# Patient Record
Sex: Male | Born: 1935 | Race: Black or African American | Hispanic: No | Marital: Married | Smoking: Never smoker
Health system: Southern US, Community
[De-identification: ages and names within clinical notes are randomized; demographics above are authoritative.]

## PROBLEM LIST (undated history)

## (undated) DIAGNOSIS — C4022 Malignant neoplasm of long bones of left lower limb: Secondary | ICD-10-CM

## (undated) DIAGNOSIS — M1990 Unspecified osteoarthritis, unspecified site: Secondary | ICD-10-CM

## (undated) DIAGNOSIS — Z9289 Personal history of other medical treatment: Secondary | ICD-10-CM

## (undated) DIAGNOSIS — K219 Gastro-esophageal reflux disease without esophagitis: Secondary | ICD-10-CM

## (undated) DIAGNOSIS — I10 Essential (primary) hypertension: Secondary | ICD-10-CM

## (undated) DIAGNOSIS — R7303 Prediabetes: Secondary | ICD-10-CM

## (undated) DIAGNOSIS — M539 Dorsopathy, unspecified: Secondary | ICD-10-CM

## (undated) HISTORY — PX: BACK SURGERY: SHX140

## (undated) HISTORY — DX: Essential (primary) hypertension: I10

## (undated) HISTORY — PX: SPINE SURGERY: SHX786

## (undated) HISTORY — PX: CERVICAL FUSION: SHX112

## (undated) HISTORY — DX: Gastro-esophageal reflux disease without esophagitis: K21.9

## (undated) HISTORY — PX: CHOLECYSTECTOMY: SHX55

## (undated) HISTORY — DX: Dorsopathy, unspecified: M53.9

---

## 1989-06-23 HISTORY — PX: HERNIA REPAIR: SHX51

## 1999-10-14 ENCOUNTER — Ambulatory Visit (HOSPITAL_COMMUNITY): Admission: RE | Admit: 1999-10-14 | Discharge: 1999-10-14 | Payer: Self-pay | Admitting: Internal Medicine

## 1999-10-14 ENCOUNTER — Encounter: Payer: Self-pay | Admitting: Internal Medicine

## 2001-01-14 ENCOUNTER — Encounter: Payer: Self-pay | Admitting: Internal Medicine

## 2001-01-14 ENCOUNTER — Encounter: Admission: RE | Admit: 2001-01-14 | Discharge: 2001-01-14 | Payer: Self-pay | Admitting: Internal Medicine

## 2001-12-03 ENCOUNTER — Encounter: Admission: RE | Admit: 2001-12-03 | Discharge: 2001-12-03 | Payer: Self-pay | Admitting: *Deleted

## 2001-12-03 ENCOUNTER — Encounter: Payer: Self-pay | Admitting: Orthopedic Surgery

## 2001-12-17 ENCOUNTER — Encounter: Admission: RE | Admit: 2001-12-17 | Discharge: 2001-12-17 | Payer: Self-pay | Admitting: Orthopedic Surgery

## 2001-12-17 ENCOUNTER — Encounter: Payer: Self-pay | Admitting: Orthopedic Surgery

## 2002-01-13 ENCOUNTER — Encounter: Admission: RE | Admit: 2002-01-13 | Discharge: 2002-01-13 | Payer: Self-pay | Admitting: Orthopedic Surgery

## 2002-01-13 ENCOUNTER — Encounter: Payer: Self-pay | Admitting: Orthopedic Surgery

## 2002-05-09 ENCOUNTER — Encounter: Payer: Self-pay | Admitting: Neurosurgery

## 2002-05-12 ENCOUNTER — Ambulatory Visit (HOSPITAL_COMMUNITY): Admission: RE | Admit: 2002-05-12 | Discharge: 2002-05-13 | Payer: Self-pay | Admitting: Neurosurgery

## 2002-05-12 ENCOUNTER — Encounter: Payer: Self-pay | Admitting: Neurosurgery

## 2002-09-02 ENCOUNTER — Ambulatory Visit (HOSPITAL_COMMUNITY): Admission: RE | Admit: 2002-09-02 | Discharge: 2002-09-02 | Payer: Self-pay | Admitting: Gastroenterology

## 2002-11-25 ENCOUNTER — Encounter: Payer: Self-pay | Admitting: Neurosurgery

## 2002-11-26 ENCOUNTER — Inpatient Hospital Stay (HOSPITAL_COMMUNITY): Admission: RE | Admit: 2002-11-26 | Discharge: 2002-11-27 | Payer: Self-pay | Admitting: Neurosurgery

## 2002-11-26 ENCOUNTER — Encounter: Payer: Self-pay | Admitting: Neurosurgery

## 2002-12-24 ENCOUNTER — Ambulatory Visit (HOSPITAL_COMMUNITY): Admission: RE | Admit: 2002-12-24 | Discharge: 2002-12-24 | Payer: Self-pay | Admitting: Neurosurgery

## 2002-12-24 ENCOUNTER — Encounter: Payer: Self-pay | Admitting: Neurosurgery

## 2002-12-24 ENCOUNTER — Inpatient Hospital Stay (HOSPITAL_COMMUNITY): Admission: AD | Admit: 2002-12-24 | Discharge: 2002-12-28 | Payer: Self-pay | Admitting: Neurosurgery

## 2002-12-25 ENCOUNTER — Encounter: Payer: Self-pay | Admitting: Neurosurgery

## 2004-11-29 ENCOUNTER — Encounter: Admission: RE | Admit: 2004-11-29 | Discharge: 2004-11-29 | Payer: Self-pay | Admitting: Internal Medicine

## 2006-12-18 ENCOUNTER — Encounter: Admission: RE | Admit: 2006-12-18 | Discharge: 2006-12-18 | Payer: Self-pay | Admitting: Internal Medicine

## 2010-01-06 ENCOUNTER — Encounter: Admission: RE | Admit: 2010-01-06 | Discharge: 2010-01-06 | Payer: Self-pay | Admitting: Internal Medicine

## 2010-10-23 DIAGNOSIS — Z9289 Personal history of other medical treatment: Secondary | ICD-10-CM

## 2010-10-23 HISTORY — DX: Personal history of other medical treatment: Z92.89

## 2011-03-10 NOTE — Op Note (Signed)
   NAME:  Kevin Mcintyre, Kevin Mcintyre                       ACCOUNT NO.:  [id number]   MEDICAL RECORD NO.:  [id number]                   PATIENT TYPE:  AMB   LOCATION:  ENDO                                 FACILITY:  [facility]   PHYSICIAN:  Anselmo Rod, M.D.               DATE OF BIRTH:  [birth year]   DATE OF PROCEDURE:  09/02/2002  DATE OF DISCHARGE:                                 OPERATIVE REPORT   PROCEDURE PERFORMED:  Colonoscopy.   ENDOSCOPIST:  Charna Elizabeth, M.D.   INSTRUMENT USED:  Olympus video colonoscope.   INDICATIONS FOR PROCEDURE:  The patient is a 77 year old African-American  male undergoing screening colonoscopy.  The patient has a history of rectal  bleeding and a known history of internal hemorrhoids.  Rule out colonic  masses, etc.   PREPROCEDURE PREPARATION:  Informed consent was procured from the patient.  The patient was fasted for eight hours prior to the procedure and prepped  with a bottle of magnesium citrate and a gallon of NuLytely the night prior  to the procedure.   PREPROCEDURE PHYSICAL:  The patient had stable vital signs. Neck supple.  Chest clear to auscultation.  S1 and S2 regular.  Abdomen soft with normal  bowel sounds.   DESCRIPTION OF PROCEDURE:  The patient was placed in left lateral decubitus  position and sedated with 60 mg of Demerol and 6 mg of Versed intravenously.  Once the patient was adequately sedated and maintained on low flow oxygen  and continuous cardiac monitoring, the Olympus video colonoscope was  advanced from the rectum and terminal ileum without difficulty.  Except for  prominent internal hemorrhoids  that seemed inflamed, no other abnormalities  were noted.  No masses, polyps, polyps, erosions, ulcerations or diverticula  were seen.   IMPRESSION:  Normal colonoscopy up to the terminal ileum except for  prominent bleeding internal hemorrhoids.   RECOMMENDATIONS:  1. Continue local steroid therapy for the hemorrhoids.  2.  Repeat colorectal cancer screening in the next 10 years unless the     patient develops any abnormal symptoms in     the interim.  3. A high fiber diet with 15 to 20 gm of fiber in the diet along with     liberal fluid intake.  4. Outpatient follow-up on a p.r.n. basis.                                                   Anselmo Rod, M.D.    JNM/MEDQ  D:  09/02/2002  T:  09/02/2002  Job:  [id number]   cc:   Olene Craven, M.D.  [phone] [address], [address]  [place]  [place] [address]  Fax: [phone]

## 2011-03-10 NOTE — Op Note (Signed)
NAME:  MANSA, WILLERS NO.:  [id number]   MEDICAL RECORD NO.:  [id number]                   PATIENT TYPE:  INP   LOCATION:  3172                                 FACILITY:  [facility]   PHYSICIAN:  Hewitt Shorts, M.D.            DATE OF BIRTH:  1934/02/24   DATE OF PROCEDURE:  11/26/2002  DATE OF DISCHARGE:                                 OPERATIVE REPORT   PREOPERATIVE DIAGNOSIS:  Recurrent right L4-5 lumbar disk herniation.   POSTOPERATIVE DIAGNOSIS:  Recurrent right L4-5 lumbar disk herniation.   PROCEDURE:  Right L4-5 lumbar laminotomy and microdiskectomy.   SURGEON:  Hewitt Shorts, M.D.   ASSISTANT:  Coletta Memos, M.D.   ANESTHESIA:  General endotracheal.   INDICATIONS FOR PROCEDURE:  The patient is a 77 year old man who is about 6-  1/2 months status post a right L4-5 lumbar laminotomy and microdiskectomy  developed recurrent radicular pain and was found by MRI scan to have  enlarged recurrent right L4-5 lumbar disk herniation.  The decision was made  to proceed with elective laminotomy and elective diskectomy.   PROCEDURE IN DETAIL:  The patient was brought to the operating room and  placed on the table in the supine position.  The patient was turned to a  prone position.  The lumbar region was prepped with Betadine soap solution  and draped in a sterile fashion.  The previous incision over the L4-5 lamina  was reopened.  The line of the incision was infiltrated with 1% __________  with epinephrine.  Dissection was carried down to the subcutaneous tissue  down to the lumbar fascia which was incised from the right side of the  midline and the paraspinal muscle was dissected from the spinous process and  lamina in a subperiosteal fashion.  The L4-5 interlaminar space was  identified and x-rays taken to confirm the localization.  The microscope was  then draped and brought into the field to provide additional magnification,  illumination, and visualization, and the remainder of the procedure was  performed using microdissection and microsurgical technique.  The scar  tissue within the laminotomy defect was carefully dissected from the edges  of the laminotomy.  The laminotomy was extended slightly rostrally and the  thecal sac and right L5 nerve root were identified.  These were gently  retracted medially and disk herniation identified.  We entered into the disk  space and removed extensive amounts of degenerated disk material.  The disk  herniation was mobilized from the ventral epidural space and we were able to  thereby decompress the thecal sac and nerve root.  __________ diskectomy was  performed with removal of all loose fragments of disk material from disk  space and the epidural space and decompression of the thecal sac and nerve  root was achieved.  Hemostasis was established with the use of bipolar  cautery as well as Gelfoam soaked in thrombin.  All  Gelfoam was removed  though prior to closure.  Once hemostasis was established we instilled 2 mL  of fentanyl and 80 mg of Depo-Medrol into the epidural space and then  proceeded with closure.  The deep fascia layer was closed with undyed 1  Vicryl sutures, the subcutaneous layer was closed with interrupted inverted  undyed 1 Vicryl sutures, the subcutaneous subcuticular were closed with  interrupted inverted 2-0 and 3-0 undyed Vicryl sutures and skin was  approximated  with Dermabond.  The patient tolerated the procedure well.  The estimated  blood loss was 25 mL.  Sponge and needle count were correct.  Following  surgery the patient was turned back to supine position, reversed from  anesthesia and extubated and transferred to the recovery room for further  care.                                               Hewitt Shorts, M.D.    RWN/MEDQ  D:  11/26/2002  T:  11/26/2002  Job:  [id number]

## 2011-03-10 NOTE — Op Note (Signed)
NAME:  Kevin Mcintyre, BATY NO.:  [id number]   MEDICAL RECORD NO.:  [id number]                   PATIENT TYPE:  INP   LOCATION:  3024                                 FACILITY:  [facility]   PHYSICIAN:  Hewitt Shorts, M.D.            DATE OF BIRTH:  [birth year]   DATE OF PROCEDURE:  12/25/2002  DATE OF DISCHARGE:                                 OPERATIVE REPORT   PREOPERATIVE DIAGNOSIS:  Recurrent right L4-5 lumbar disk herniation.   POSTOPERATIVE DIAGNOSIS:  Recurrent L4-5 lumbar disk herniation.   PROCEDURES:  1. Right L4-5 lumbar laminotomy and microdiskectomy with microdissection.  2. A right L4-5 transverse posterior lumbar interbody fusion with 13 mm     Synthes T-PLIF bone and Vitoss with bone marrow aspirate.  3. Bilateral L4-5 posterolateral arthrodesis with 90D posterior     instrumentation and Vitoss with bone marrow aspirate, with intraoperative     C-arm fluoroscopy.   SURGEON:  Hewitt Shorts, M.D.   ASSISTANT:  Clydene Fake, M.D.   ANESTHESIA:  General endotracheal.   INDICATIONS:  The patient is a 77 year old man who presented with a  recurrent lumbar radiculopathy and was found by MRI scan to have a large  recurrent L4-5 disk herniation.  A decision was made to proceed with  diskectomy and arthrodesis.   DESCRIPTION OF PROCEDURE:  The patient was brought to the operating room and  placed under general endotracheal anesthesia. The patient was turned to a  prone position.  The lumbar region was prepped with Betadine soap and  solution and draped in a sterile fashion.  The previous midline incision was  reopened and the incision was extended rostrally and caudally.  Dissection  was carried down to the lumbar fascia, which was incised bilaterally, and  the paraspinal muscles were dissected from the spinous processes and laminae  in a subperiosteal fashion.  Particular care, though, was taken at the L4-5  level, where there was  laminotomy.  We were able to expose the laminotomy  and dissect around it.  We exposed the transverse process of L4 and L5  bilaterally and decorticated the surfaces of the bone.  The microscope was  draped and brought into the field to provide additional magnification,  illumination, and visualization, and then the L4-5 laminotomy was extended  using the [facility] Max drill and Kerrison punches.  The thecal sac and right L4  and L5 nerve roots were identified.  The disk space was identified, and we  were able to enter into the disk space and initiate the diskectomy using a  variety of pituitary rongeurs.  We were able to remove fragments of disk  from both the disk space and the epidural space.  We were able to decompress  the thecal sac and nerve roots.  We identified the pedicles of L4 and L5 on  the right side.  The end plates of the L4 and  L5 vertebrae were cleaned of  the cartilaginous end plate and then using a variety of trial spacers, we  selected a 13 mm T-PLIF bone.  We used a laminar spreader on the facet  remnants of L4 and L5 on the right side to create a bit of gentle  distraction and then a 32 mm graft was positioned and tamped into position  and placed.  The distraction was then relaxed and the graft was secured in  place.  We then brought into the field the fluoroscopy unit and identified  pedicle entry sites for L4 and L5 bilaterally.  The cortex overlying the  pedicle entry site was opened using the [facility] drill and then using  pedicle probes, we probed each of the four pedicles.  This was examined with  a blunt probe, then.  No cut-outs were found.  We then used a 5.25 mm tap.  Each pedicle was tapped, and then we placed a 6.75 x 40 mm screw at each  site.  After each pedicle was tapped, we did use a ball probe to examine the  threading and to again check for any cut-outs, and none were found.  Once  all four screws were in place we took an AP image, which showed good   trapezoidal trajectory.  We then placed the 40 mm rod on the left, and we  cut a rod 30 mm in length to place on the right.  Locking caps were placed,  and final tightening was done of the locking caps.  We then packed the  intervertebral disk space with Vitoss which had been soaked in bone marrow  aspirate that had been obtained when we probed the pedicles and aspirated  bone marrow aspirate from the vertebral bodies.  The bone graft was packed  into the intervertebral disk space and then was also packed over the  transverse processes at L4 and L5 bilaterally as well as across the  intertransverse space at that level.  The thecal sac and nerve roots were  then checked to ensure that they were well-decompressed and that none of the  bone graft was pressing up against them.  This was confirmed and then we  proceeded with closure.  The deep fascia was closed with interrupted, undyed  1 Vicryl suture, the subcutaneous and subcuticular layer were closed with  interrupted, inverted 2-0 undyed Vicryl sutures, and the skin was  reapproximated with Dermabond.  The patient tolerated the procedure well.  The estimated blood loss was 600 mL.  We were able to give the patient back  200 mL of Cell Saver blood.  Sponge and needle count were correct.  Following surgery the patient was turned back to the supine position,  reversed from the anesthetic, to be extubated and transferred to the  recovery room for further care and was noted to be moving all four  extremities to command.                                                Hewitt Shorts, M.D.    RWN/MEDQ  D:  12/25/2002  T:  12/26/2002  Job:  [id number]

## 2011-03-10 NOTE — H&P (Signed)
NAME:  Kevin Mcintyre, LAMP NO.:  [id number]   MEDICAL RECORD NO.:  [id number]                   PATIENT TYPE:  OUT   LOCATION:  MRI                                  FACILITY:  [facility]   PHYSICIAN:  Hewitt Shorts, M.D.            DATE OF BIRTH:  08-Jan-1934   DATE OF ADMISSION:  12/24/2002  DATE OF DISCHARGE:                                HISTORY & PHYSICAL   HISTORY OF PRESENT ILLNESS:  The patient is a 77 year old right handed black  male who has been a patient of mine since August, 1997.  He is most recently  one month status post a right L4-5 lumbar laminotomy and micro diskectomy  for disk herniation, having undergone a right L4-5 lumbar laminotomy and  micro diskectomy for his initial disk herniation in July, 2003.   The patient explains that he did well for the first two weeks following his  most recent surgery.  He was immobilized postoperatively in a lumbar corset  however, approximately two weeks following that surgery he developed  recurrent right lumbar radicular pain.  He was seen in the office last week  and we scheduled an MRI scan for evaluation that was performed earlier today  at [facility].  It revealed a very large recurrent right L4-5  lumbar disk herniation.  There is disk space narrowing at L4-5.   The patient is admitted at this time from the office for parenteral  analgesia via PCA morphine pump with the anticipation of proceeding with  diskectomy and fusion tomorrow.   PAST MEDICAL HISTORY:  Notable for history of hypertension for the past  three years or so which has been treated.  He does not have any history of  myocardial infarction, cancer, stroke, diabetes, peptic ulcer disease or  lung disease.   PAST SURGICAL HISTORY:  Includes C5-6 and C6-7 __________ in October, 1997  which I performed, herniorrhaphy by Dr. Orpah Greek in August, 1997,  cholecystectomy by Dr. Orpah Greek in December, 1997 and his right L4-5  lumbar  diskectomy in July, 2003 and again in February, 2004.   ALLERGIES:  He denies allergies to medications.   MEDICATIONS:  Current medications include:  1. Lotrel 5/10 one tablet daily for hypertension.  2. Prevacid 30 mg daily for stomach.   FAMILY HISTORY:  His mother died at age 34.  She had emphysema.  His father  died at age 58.  He had atherosclerosis.   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  He  had previously worked as a Counsellor.  He is married.  He does not smoke,  drinks a little bit but does not have a history of substance abuse.   REVIEW OF SYSTEMS:  Notable for systems described in history of present  illness and past medical history.  Fourteen point review of systems  otherwise unremarkable.   PHYSICAL EXAMINATION:  GENERAL:  The patient is a well-developed, well-  nourished, black male in obvious pain and discomfort.  He is having to use a  quad cane which gives him support as he tries to walk.  LUNGS:  Clear to auscultation.  He has symmetrical respirations.  HEART:  Regular rate and rhythm.  S1, S2, there is no murmur.  ABDOMEN:  Soft, nontender, bowel sounds are present.  EXTREMITIES:  No clubbing, cyanosis, edema.  NEUROLOGIC:  5/5 strength in the dorsi flexion, plantar flexion and extensor  hallucis longus bilaterally.  However, he has significant pain on exertion  of motor testing.   IMPRESSION:  Large recurrent right L4-5 lumbar disk herniation which  demonstrates significant instability of the lumbar spine.  He has underlying  degenerative disk disease with spondylosis.   PLAN:  Patient admitted for parenteral analgesia via PCA morphine pump.  Preoperative laboratories will be requested and we will plan on proceeding  with a right L4-5 lumbar diskectomy, bilateral L4-5 posterior lumbar  antibody fusion with bone graft and bilateral posterior lateral arthrodesis  at L4-5 with posterior instrumentation and bone  graft.   I have spoken to the patient and his wife at length and also discussed the  nature of the procedure, the surgery, hospital stay, overall expectations,  limitations postoperatively, the postoperative immobilization and lumbar  corset and risk of infection, bleeding, possibility for transfusion, the  risk of no reduction of pain, weakness, lumbar paresthesias, the risk of  failure of the fusion and anesthetic risks of myocardial infarction, stroke,  and death.  After having discussed this thoroughly he would like to go ahead  with surgery and is being admitted today for analgesia.                                               Hewitt Shorts, M.D.    RWN/MEDQ  D:  12/24/2002  T:  12/24/2002  Job:  [id number]

## 2011-03-10 NOTE — Op Note (Signed)
[place]  Patient:    Kevin Mcintyre, Kevin Mcintyre Visit Number: [id number] MRN: [id number]          Service Type: DSU Location: 3000 [id number] Attending Physician:  Barton Fanny Dictated by:   Hewitt Shorts, M.D. Proc. Date: 05/12/02 Admit Date:  05/12/2002 Discharge Date: 05/13/2002                             Operative Report  PREOPERATIVE DIAGNOSIS:  Right L4-5 lumbar disk herniation.  POSTOPERATIVE DIAGNOSIS:  Right L4-5 lumbar disk herniation.  PROCEDURE:  Right L4-5 lumbar laminotomy and microdiskectomy with microdissection.  SURGEON:  Hewitt Shorts, M.D.  ASSISTANT:  Stefani Dama, M.D.  ANESTHESIA:  General endotracheal.  INDICATION:  The patient is a 77 year old man who presented with a right lumbar radiculopathy, who was found on MRI scan to have a right L4-5 lumbar disk herniation.  A decision was made to proceed with elective laminotomy and microdiskectomy.  DESCRIPTION OF PROCEDURE:  The patient was brought to the operating room and placed under general endotracheal anesthesia.  The patient was turned to a prone position.  The lumbar region was prepped with Betadine soap and solution and draped in a sterile fashion.  The midline was infiltrated with local anesthetic with epinephrine.  An x-ray was taken and the L4-5 level identified and a midline incision was made, carried down through the subcutaneous tissue with bipolar cautery and electrocautery were used to maintain hemostasis. Dissection was carried down to the lumbar fascia, which was incised on the right side of the midline and the paraspinal muscles were dissected from the spinous processes and laminae in subperiosteal fashion.  The L4-5 interlaminar space was identified and an x-ray was taken to confirm the localization, and then the microscope was draped and brought into the field to provide additional magnification, illumination, and visualization,  and the remainder of the procedure was performed using microdissection and microsurgical technique.  A laminotomy was performed using the [facility] Max drill and Kerrison punches.  Edges of the bone were waxed as necessary to maintain hemostasis. Ligamentum flavum was carefully resected and the thecal sac and right L5 nerve root were identified.  These were gently retracted medially and the L4-5 disk identified.  The disk herniation was subligamentous.  The remaining annular fibers were incised, and we proceeded with a thorough diskectomy using a variety of pituitary rongeurs.  All loose fragments of disk material were removed from the disk space and the epidural space, and good decompression of the thecal sac and nerve root was achieved.  Once the decompression was completed, the diskectomy was completed and hemostasis established.  We instilled 2 cc of fentanyl with 80 mg of Depo-Medrol into the epidural space, and then we proceeded with closure.  The deep fascia closed with interrupted, undyed 0 Vicryl sutures and the subcutaneous and subcuticular layer were closed with interrupted, inverted 2-0 undyed Vicryl sutures, and the skin was reapproximated with Dermabond.  The patient tolerated the procedure well.  The estimated blood loss was 100 cc.  The sponge and needle count were correct. Following surgery the patient was turned back to the supine position, reversed from the anesthetic, extubated, and transferred to the recovery room for further care. Dictated by:   Hewitt Shorts, M.D. Attending Physician:  Barton Fanny DD:  05/12/02 TD:  05/15/02 Job: [id number]

## 2011-03-10 NOTE — Discharge Summary (Signed)
   NAME:  Kevin Mcintyre, Kevin Mcintyre                       ACCOUNT NO.:  [id number]   MEDICAL RECORD NO.:  [id number]                   PATIENT TYPE:  INP   LOCATION:  3024                                 FACILITY:  [facility]   PHYSICIAN:  Stefani Dama, M.D.               DATE OF BIRTH:  [birth year]   DATE OF ADMISSION:  12/24/2002  DATE OF DISCHARGE:  12/28/2002                                 DISCHARGE SUMMARY   ADMISSION DIAGNOSES:  1. Lumbar herniated nucleus pulposus.  2. Spondylosis.   DISCHARGE DIAGNOSES:  1. Lumbar herniated nucleus pulposus.  2. Spondylosis.   OPERATION:  Lumbar laminectomy, diskectomy, posterior lumbar interbody  arthrodesis, and instrumentation at L4-L5 on December 24, 2002.   CONDITION ON DISCHARGE:  Improving.   HISTORY:  The patient is a 77 year old individual who has had significant  back and right lower extremity pain, secondary to a large recurrent  herniated nucleus pulposus at L4-5.  He underwent a surgical decompression  of this lesion, followed stabilization of the lumbar spine.  He has been  ambulatory postoperatively and is currently managed with oral Percocet.   DISCHARGE MEDICATIONS:  He is given a prescription for #60 of these, without  refills, in addition to Valium 5 mg, #30, without refills.   FOLLOW UP:  He will be seen in the office in two weeks' time.   CONDITION ON DISCHARGE:  His incision is clean and dry.  His condition on  discharge is improved.   DISCHARGE DIAGNOSES:  1. Lumbar herniated nucleus pulposus.  2. Spondylosis.                                               Stefani Dama, M.D.    Merla Riches  D:  12/28/2002  T:  12/29/2002  Job:  [id number]

## 2012-01-20 ENCOUNTER — Ambulatory Visit (INDEPENDENT_AMBULATORY_CARE_PROVIDER_SITE_OTHER): Payer: 59 | Admitting: Emergency Medicine

## 2012-01-20 VITALS — BP 124/72 | HR 56 | Temp 98.3°F | Resp 16 | Ht 68.0 in | Wt 171.6 lb

## 2012-01-20 DIAGNOSIS — J329 Chronic sinusitis, unspecified: Secondary | ICD-10-CM

## 2012-01-20 DIAGNOSIS — J019 Acute sinusitis, unspecified: Secondary | ICD-10-CM

## 2012-01-20 DIAGNOSIS — E042 Nontoxic multinodular goiter: Secondary | ICD-10-CM

## 2012-01-20 DIAGNOSIS — J069 Acute upper respiratory infection, unspecified: Secondary | ICD-10-CM

## 2012-01-20 MED ORDER — AMOXICILLIN 875 MG PO TABS: 875.0000 mg | ORAL_TABLET | Freq: Two times a day (BID) | ORAL | Status: AC

## 2012-01-20 MED ORDER — FLUTICASONE PROPIONATE 50 MCG/ACT NA SUSP: 2.0000 | Freq: Every day | NASAL | Status: DC

## 2012-01-20 NOTE — Progress Notes (Signed)
  Subjective:    Patient ID: Kevin Mcintyre, male    DOB: Dec 24, 1933, 78 y.o.   MRN: [id number]  HPI patient enters with a 3 to four-day history of head congestion sore throat. He has had postnasal drip which is been yellowish material. He has not had much of a cough    Review of Systems he denies any fevers chills or sore     Objective:   Physical Exam  Constitutional: He appears well-developed and well-nourished.  HENT:  Head: Normocephalic.  Eyes: Pupils are equal, round, and reactive to light.  Neck: No tracheal deviation present. Thyromegaly present.  Cardiovascular: Regular rhythm.   Pulmonary/Chest: Effort normal and breath sounds normal. No respiratory distress. He has no rales.          Assessment & Plan:  Patient here with upper respiratory infection with sinusitis we'll treat with amoxicillin and Flonase. He'll followup with Dr. Nicholos Johns about  his goiter

## 2012-07-09 DIAGNOSIS — L821 Other seborrheic keratosis: Secondary | ICD-10-CM | POA: Diagnosis not present

## 2012-07-09 DIAGNOSIS — B029 Zoster without complications: Secondary | ICD-10-CM | POA: Diagnosis not present

## 2012-09-10 DIAGNOSIS — M47817 Spondylosis without myelopathy or radiculopathy, lumbosacral region: Secondary | ICD-10-CM | POA: Diagnosis not present

## 2012-09-10 DIAGNOSIS — M545 Low back pain, unspecified: Secondary | ICD-10-CM | POA: Diagnosis not present

## 2012-09-10 DIAGNOSIS — M5137 Other intervertebral disc degeneration, lumbosacral region: Secondary | ICD-10-CM | POA: Diagnosis not present

## 2012-09-10 DIAGNOSIS — M4120 Other idiopathic scoliosis, site unspecified: Secondary | ICD-10-CM | POA: Diagnosis not present

## 2012-09-11 DIAGNOSIS — Z1211 Encounter for screening for malignant neoplasm of colon: Secondary | ICD-10-CM | POA: Diagnosis not present

## 2012-09-11 DIAGNOSIS — K5730 Diverticulosis of large intestine without perforation or abscess without bleeding: Secondary | ICD-10-CM | POA: Diagnosis not present

## 2012-09-11 DIAGNOSIS — K648 Other hemorrhoids: Secondary | ICD-10-CM | POA: Diagnosis not present

## 2012-09-28 ENCOUNTER — Ambulatory Visit (INDEPENDENT_AMBULATORY_CARE_PROVIDER_SITE_OTHER): Payer: Medicare Other | Admitting: Family Medicine

## 2012-09-28 VITALS — BP 128/72 | HR 61 | Temp 98.2°F | Resp 16 | Ht 68.0 in | Wt 175.0 lb

## 2012-09-28 DIAGNOSIS — M722 Plantar fascial fibromatosis: Secondary | ICD-10-CM

## 2012-09-28 DIAGNOSIS — J329 Chronic sinusitis, unspecified: Secondary | ICD-10-CM

## 2012-09-28 MED ORDER — PREDNISONE 20 MG PO TABS: ORAL_TABLET | ORAL | Status: DC

## 2012-09-28 MED ORDER — FLUTICASONE PROPIONATE 50 MCG/ACT NA SUSP: 2.0000 | Freq: Every day | NASAL | Status: DC

## 2012-09-28 NOTE — Progress Notes (Signed)
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  No trauma. Progressive pain  O:  NAD Normal appearance Tender medial and central heel on right.  A:  Plantar fasciitis P:

## 2012-09-28 NOTE — Patient Instructions (Addendum)

## 2012-10-09 ENCOUNTER — Ambulatory Visit (INDEPENDENT_AMBULATORY_CARE_PROVIDER_SITE_OTHER): Payer: Medicare Other | Admitting: Family Medicine

## 2012-10-09 VITALS — BP 128/74 | HR 60 | Temp 98.0°F | Resp 16 | Ht 68.0 in | Wt 174.0 lb

## 2012-10-09 DIAGNOSIS — M722 Plantar fascial fibromatosis: Secondary | ICD-10-CM | POA: Diagnosis not present

## 2012-10-09 DIAGNOSIS — M79609 Pain in unspecified limb: Secondary | ICD-10-CM

## 2012-10-09 MED ORDER — METHYLPREDNISOLONE ACETATE 80 MG/ML IJ SUSP: 40.0000 mg | Freq: Once | INTRAMUSCULAR | Status: DC

## 2012-10-09 NOTE — Progress Notes (Signed)
The 78-year-old gentleman comes in with persistent right plantar fascia is. He works for [employer] and although the Celebrex was working for while, the last 2 days of been quite painful. (Please see previous note)  Objective: Patient has point tenderness over the medial proximal calcaneus on the sole of his foot.  Area was prepped with Betadine after identifying the most tender area. Next, the area was sprayed with ethyl chloride and 1% Xylocaine was infiltrated in the skin. The third step was injecting a combination of 40 mg of Depo-Medrol and 1/2 cc of 0.5% Marcaine. Patient had good pain relief and walked out without problem.  Assessment: Plantar fasciitis-status post injection  Plan: Expect some aching over the next couple days but by Saturday and Sunday the pain to be pretty much gone otherwise return.

## 2012-10-09 NOTE — Patient Instructions (Addendum)

## 2012-10-20 ENCOUNTER — Ambulatory Visit (INDEPENDENT_AMBULATORY_CARE_PROVIDER_SITE_OTHER): Payer: Medicare Other | Admitting: Family Medicine

## 2012-10-20 ENCOUNTER — Encounter: Payer: Self-pay | Admitting: Family Medicine

## 2012-10-20 ENCOUNTER — Ambulatory Visit: Payer: Medicare Other

## 2012-10-20 VITALS — BP 143/75 | HR 78 | Temp 98.0°F | Resp 16 | Ht 68.0 in | Wt 177.8 lb

## 2012-10-20 DIAGNOSIS — M722 Plantar fascial fibromatosis: Secondary | ICD-10-CM | POA: Diagnosis not present

## 2012-10-20 DIAGNOSIS — M79609 Pain in unspecified limb: Secondary | ICD-10-CM

## 2012-10-20 DIAGNOSIS — M79671 Pain in right foot: Secondary | ICD-10-CM

## 2012-10-20 NOTE — Patient Instructions (Signed)
You can start the stretches and ice massage as we discussed.  See the handout.  We will refer you to podiatry.  Let us know if you would like a note out of work for a week if needed.  Return to the clinic or go to the nearest emergency room if any of your symptoms worsen or new symptoms occur.

## 2012-10-20 NOTE — Progress Notes (Signed)
  Subjective:    Patient ID: Kevin Mcintyre, male    DOB: 03/22/1934, 78 y.o.   MRN: [id number]  HPI Kevin Mcintyre is a 78 y.o. male Seen 09/28/12 for heel pain.  diagnosed with plantar fasciitis. Prior on Celebrex for back pain.  Treated with 60mg  to 10 mg prednisone taper. Repeat OV 11 days later (10/09/12)- continued pain.  Injected in office with DepoMedrol.  Still taking Celebrex - taking 200mg  each day.   Pain now there for past 6- 8 weeks.  Felt like prednisone helped a little bit for a few days, then pain came back.  Not much better with injection. Felt about same after injection.  Hurting worse now. No redness or rash. No ice massage or stretches.  Has tried to do some hot soaks.   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 [employer].    Review of Systems  Constitutional: Negative for fever and chills.  Skin: Negative for rash.       Objective:   Physical Exam  Constitutional: He is oriented to person, place, and time. He appears well-developed and well-nourished.  Pulmonary/Chest: Effort normal.  Musculoskeletal:       Right ankle: Normal. He exhibits normal range of motion and no swelling. no tenderness.       Feet:  Neurological: He is alert and oriented to person, place, and time.  Skin: Skin is warm and dry. No rash noted. No erythema.  Psychiatric: He has a normal mood and affect. His behavior is normal.    [facility] reading (PRIMARY) by  Dr. Earnestine Leys calcaneus: small plantar calcaneal spur, otherwise NAD.       Assessment & Plan:  Kevin Mcintyre is a 78 y.o. male 1. Right foot pain  DG [place] Right, Ambulatory referral to Podiatry  2. Pain of right heel  Ambulatory referral to Podiatry  3. Plantar fasciitis of right foot     R foot pain with plantar fasciitis, s/p prednisone taper and injection. Pain now in pf area and into plantar heel. No significant relief with prior treatments. Discussed ice massage and stretches, taught PF  stretch.  Refer to podiatry for eval.  Offered 1 week out of work for relative rest from walking and home care. He will call us on timing of this and we can print letter at that point. Also discussed viscoelastic or silicone pad otc under heel as needed for comfort.    Patient Instructions  You can start the stretches and ice massage as we discussed.  See the handout.  We will refer you to podiatry.  Let us know if you would like a note out of work for a week if needed.  Return to the clinic or go to the nearest emergency room if any of your symptoms worsen or new symptoms occur.

## 2012-11-01 ENCOUNTER — Ambulatory Visit (INDEPENDENT_AMBULATORY_CARE_PROVIDER_SITE_OTHER): Payer: 59 | Admitting: Family Medicine

## 2012-11-01 VITALS — BP 150/82 | HR 82 | Temp 97.9°F | Resp 18 | Ht 68.0 in | Wt 177.0 lb

## 2012-11-01 DIAGNOSIS — J329 Chronic sinusitis, unspecified: Secondary | ICD-10-CM

## 2012-11-01 DIAGNOSIS — J40 Bronchitis, not specified as acute or chronic: Secondary | ICD-10-CM

## 2012-11-01 DIAGNOSIS — H1130 Conjunctival hemorrhage, unspecified eye: Secondary | ICD-10-CM | POA: Diagnosis not present

## 2012-11-01 DIAGNOSIS — R062 Wheezing: Secondary | ICD-10-CM

## 2012-11-01 DIAGNOSIS — R05 Cough: Secondary | ICD-10-CM

## 2012-11-01 DIAGNOSIS — I10 Essential (primary) hypertension: Secondary | ICD-10-CM

## 2012-11-01 MED ORDER — CEFDINIR 300 MG PO CAPS: 300.0000 mg | ORAL_CAPSULE | Freq: Two times a day (BID) | ORAL | Status: DC

## 2012-11-01 MED ORDER — BENZONATATE 100 MG PO CAPS: 100.0000 mg | ORAL_CAPSULE | Freq: Three times a day (TID) | ORAL | Status: DC | PRN

## 2012-11-01 MED ORDER — ALBUTEROL SULFATE HFA 108 (90 BASE) MCG/ACT IN AERS: 2.0000 | INHALATION_SPRAY | Freq: Four times a day (QID) | RESPIRATORY_TRACT | Status: DC | PRN

## 2012-11-01 NOTE — Progress Notes (Signed)
[facility] [address], [place] [address] [phone]  Date:  11/01/2012   Name:  Kevin Mcintyre   DOB:  August 23, 1934   MRN:  [id number]  PCP:  Lolita Patella, MD    Chief Complaint: Cough, Nasal Congestion and Eye Burn   History of Present Illness:  Kevin Mcintyre is a 79 y.o. very pleasant male patient who presents with the following:  Here today with illness.  He is treated with norvasc and benicar, as well as flonase/ protonix/ zantac/ celebrex and ultram PRN.  He has noted a "bad cough," head congestion and cough for about 10 days. He got worse over the last few days.   He has a bloodshot right eye for the last week or so- he thinks this is due to coughing.   He is blowing a lot of mucus from his nose and coughing up mucus.    He has not noted a fever, no chills or aches.  He did have his flu shot No GI symptoms He can see normally.  He did note a little bit of burning in the corner of his eye, but nothing serious.  Vision is normal His wife is doing well at home.  However, one if his daughters is very ill- she in in [place] in the ICU with an unrelated illness.    There is no problem list on file for this patient.   Past Medical History  Diagnosis Date  . Hypertension   . Back problem   . GERD (gastroesophageal reflux disease)     Past Surgical History  Procedure Date  . Hernia repair   . Spine surgery     History  Substance Use Topics  . Smoking status: Never Smoker   . Smokeless tobacco: Not on file  . Alcohol Use: No    No family history on file.  No Known Allergies  Medication list has been reviewed and updated.  Current Outpatient Prescriptions on File Prior to Visit  Medication Sig Dispense Refill  . amLODipine (NORVASC) 5 MG tablet Take 5 mg by mouth daily.      . celecoxib (CELEBREX) 200 MG capsule Take 200 mg by mouth 2 (two) times daily.      . fluticasone (FLONASE) 50 MCG/ACT nasal spray Place 2  sprays into the nose daily.  16 g  6  . olmesartan (BENICAR) 20 MG tablet Take 20 mg by mouth daily.      . pantoprazole (PROTONIX) 40 MG tablet Take 40 mg by mouth daily.      . ranitidine (ZANTAC) 300 MG tablet Take 300 mg by mouth at bedtime.      . traMADol (ULTRAM) 50 MG tablet Take 50 mg by mouth 2 (two) times daily.      . predniSONE (DELTASONE) 20 MG tablet 3-2-2-2-1-1-1-1  13 tablet  0    Review of Systems:  As per HPI- otherwise negative.   Physical Examination: Filed Vitals:   11/01/12 1012  BP: 150/82  Pulse: 82  Temp: 97.9 F (36.6 C)  Resp: 18   Filed Vitals:   11/01/12 1012  Height: 5\' 8"  (1.727 m)  Weight: 177 lb (80.287 kg)   Body mass index is 26.91 kg/(m^2). Ideal Body Weight: Weight in (lb) to have BMI = 25: 164.1   GEN: WDWN, NAD, Non-toxic, A & O x 3, looks well HEENT: Atraumatic, Normocephalic. Neck supple. No masses, No LAD. Bilateral TM wnl, oropharynx normal.  PEERL,EOMI.   Right eye shows a sunconjunctival hemorrhage on the lateral side of the cornea.  Fundoscopic exam wnl.  Conjunctivae are normal, no discharge Ears and Nose: No external deformity. CV: RRR, No M/G/R. No JVD. No thrill. No extra heart sounds. PULM: CTA B, no wheezes, crackles, rhonchi. No retractions. No resp. distress. No accessory muscle use. EXTR: No c/c/e NEURO Normal gait.  PSYCH: Normally interactive. Conversant. Not depressed or anxious appearing.  Calm demeanor.    Assessment and Plan: 1. Bronchitis  cefdinir (OMNICEF) 300 MG capsule  2. HTN (hypertension)    3. Sinusitis  cefdinir (OMNICEF) 300 MG capsule  4. Subconjunctival hemorrhage    5. Wheezing  albuterol (PROVENTIL HFA;VENTOLIN HFA) 108 (90 BASE) MCG/ACT inhaler  6. Cough  benzonatate (TESSALON) 100 MG capsule   Treat as above with omnicef, albuterol and tessalon perles.  Reassured that his subconjunctival hemorrhage will get better without any topical treatments.  He is to let me know if any other problems  with his eye  Abbe Amsterdam, MD

## 2012-11-01 NOTE — Patient Instructions (Addendum)
We are going to treat you with antibiotics for your sinuses and chest.  Use the albuterol inhaler also as needed for coughing and wheezing.  Some OTC cough syrup such as delsym may be helpful.  You may also use the tessalon perles as needed.  Let me know if you are not better in the next few days- Sooner if worse.      Subconjunctival Hemorrhage A subconjunctival hemorrhage is a bright red patch covering a portion of the white of the eye. The white part of the eye is called the sclera, and it is covered by a thin membrane called the conjunctiva. This membrane is clear, except for tiny blood vessels that you can see with the naked eye. When your eye is irritated or inflamed and becomes red, it is because the vessels in the conjunctiva are swollen. Sometimes, a blood vessel in the conjunctiva can break and bleed. When this occurs, the blood builds up between the conjunctiva and the sclera, and spreads out to create a red area. The red spot may be very small at first. It may then spread to cover a larger part of the surface of the eye, or even all of the visible white part of the eye. In almost all cases, the blood will go away and the eye will become white again. Before completely dissolving, however, the red area may spread. It may also become brownish-yellow in color, before going away. If a lot of blood collects under the conjunctiva, it may look like a bulge on the surface of the eye. This looks scary, but it will also eventually flatten out and go away. Subconjunctival hemorrhages do not cause pain, but if swollen, may cause a feeling of irritation. There is no effect on vision.  CAUSES   The most common cause is mild trauma (rubbing the eye, irritation).  Subconjunctival hemorrhages can happen because of coughing or straining (lifting heavy objects), vomiting, or sneezing.  In some cases, your doctor may want to check your blood pressure. High blood pressure can also cause a sunconjunctival  hemorrhage.  Severe trauma or blunt injuries.  Diseases that affect blood clotting (hemophilia, leukemia).  Abnormalities of blood vessels behind the eye (carotid cavernous sinus fistula).  Tumors behind the eye.  Certain drugs (aspirin, coumadin, heparin).  Recent eye surgery. HOME CARE INSTRUCTIONS   Do not worry about the appearance of your eye. You may continue your usual activities.  Often, follow-up is not necessary. SEEK MEDICAL CARE IF:   Your eye becomes painful.  The bleeding does not disappear within 3 weeks.  Bleeding occurs elsewhere, for example, under the skin, in the mouth, or in the other eye.  You have recurring subconjunctival hemorrhages. SEEK IMMEDIATE MEDICAL CARE IF:   Your vision changes or you have difficulty seeing.  You develop severe headache, persistent vomiting, confusion, or abnormal drowsiness (lethargy).  Your eye seems to bulge or protrude from the eye socket.  You notice the sudden appearance of bruises, or have spontaneous bleeding elsewhere on your body. Document Released: 10/09/2005 Document Revised: 01/01/2012 Document Reviewed: 09/06/2009 [facility] Patient Information 2013 [place], [place].

## 2012-11-06 DIAGNOSIS — M722 Plantar fascial fibromatosis: Secondary | ICD-10-CM | POA: Diagnosis not present

## 2012-11-06 DIAGNOSIS — M19079 Primary osteoarthritis, unspecified ankle and foot: Secondary | ICD-10-CM | POA: Diagnosis not present

## 2012-11-06 DIAGNOSIS — M7730 Calcaneal spur, unspecified foot: Secondary | ICD-10-CM | POA: Diagnosis not present

## 2012-11-06 DIAGNOSIS — M79609 Pain in unspecified limb: Secondary | ICD-10-CM | POA: Diagnosis not present

## 2012-11-23 ENCOUNTER — Ambulatory Visit (INDEPENDENT_AMBULATORY_CARE_PROVIDER_SITE_OTHER): Payer: Medicare Other | Admitting: Family Medicine

## 2012-11-23 VITALS — BP 150/92 | HR 67 | Temp 97.9°F | Resp 17 | Ht 69.0 in | Wt 177.0 lb

## 2012-11-23 DIAGNOSIS — J40 Bronchitis, not specified as acute or chronic: Secondary | ICD-10-CM | POA: Diagnosis not present

## 2012-11-23 DIAGNOSIS — R05 Cough: Secondary | ICD-10-CM

## 2012-11-23 DIAGNOSIS — R059 Cough, unspecified: Secondary | ICD-10-CM | POA: Diagnosis not present

## 2012-11-23 DIAGNOSIS — J329 Chronic sinusitis, unspecified: Secondary | ICD-10-CM | POA: Diagnosis not present

## 2012-11-23 MED ORDER — BENZONATATE 100 MG PO CAPS: 100.0000 mg | ORAL_CAPSULE | Freq: Three times a day (TID) | ORAL | Status: DC | PRN

## 2012-11-23 MED ORDER — AMOXICILLIN-POT CLAVULANATE 875-125 MG PO TABS: 1.0000 | ORAL_TABLET | Freq: Two times a day (BID) | ORAL | Status: DC

## 2012-11-23 NOTE — Progress Notes (Signed)
[facility]:  Office Visit  Chief Complaint:  Chief Complaint  Patient presents with  . Nasal Congestion    mucus   . URI    HPI: Kevin Mcintyre is a 79 y.o. male who complains of  nasal congestion, cough with productive yellow and clear sputum since January 1, came in to see Dr. Patsy Lager on January 10 and was given Omnicef. He had some improvement but his symptoms has returned in last 5-6 days  He does have a h/o GERD and HTN and is on an ARB and also PPI but this cough is not related to his GERD/ARB. Denies fevers/chills/CP/SOB. He has pressure in head, nasal congestion. Denies ear, + facial pain. He has been taking his flonase.  Past Medical History  Diagnosis Date  . Hypertension   . Back problem   . GERD (gastroesophageal reflux disease)    Past Surgical History  Procedure Date  . Hernia repair   . Spine surgery    History   Social History  . Marital Status: Married    Spouse Name: N/A    Number of Children: N/A  . Years of Education: N/A   Social History Main Topics  . Smoking status: Never Smoker   . Smokeless tobacco: None  . Alcohol Use: No  . Drug Use: No  . Sexually Active: None   Other Topics Concern  . None   Social History Narrative  . None   No family history on file. No Known Allergies Prior to Admission medications   Medication Sig Start Date End Date Taking? Authorizing Provider  amLODipine (NORVASC) 5 MG tablet Take 5 mg by mouth daily.   Yes Historical Provider, MD  benzonatate (TESSALON) 100 MG capsule Take 1 capsule (100 mg total) by mouth 3 (three) times daily as needed for cough. 11/01/12  Yes Gwenlyn Found Copland, MD  cefdinir (OMNICEF) 300 MG capsule Take 1 capsule (300 mg total) by mouth 2 (two) times daily. 11/01/12  Yes Gwenlyn Found Copland, MD  celecoxib (CELEBREX) 200 MG capsule Take 200 mg by mouth 2 (two) times daily.   Yes Historical Provider, MD  fluticasone (FLONASE) 50 MCG/ACT nasal spray Place 2 sprays into the  nose daily. 09/28/12 09/28/13 Yes Elvina Sidle, MD  olmesartan (BENICAR) 20 MG tablet Take 20 mg by mouth daily.   Yes Historical Provider, MD  pantoprazole (PROTONIX) 40 MG tablet Take 40 mg by mouth daily.   Yes Historical Provider, MD  predniSONE (DELTASONE) 20 MG tablet 3-2-2-2-1-1-1-1 09/28/12  Yes Elvina Sidle, MD  ranitidine (ZANTAC) 300 MG tablet Take 300 mg by mouth at bedtime.   Yes Historical Provider, MD  traMADol (ULTRAM) 50 MG tablet Take 50 mg by mouth 2 (two) times daily.   Yes Historical Provider, MD  albuterol (PROVENTIL HFA;VENTOLIN HFA) 108 (90 BASE) MCG/ACT inhaler Inhale 2 puffs into the lungs every 6 (six) hours as needed for wheezing. 11/01/12   Gwenlyn Found Copland, MD     ROS: The patient denies fevers, chills, night sweats, unintentional weight loss, chest pain, palpitations, wheezing, dyspnea on exertion, nausea, vomiting, abdominal pain, dysuria, hematuria, melena, numbness, weakness, or tingling.   All other systems have been reviewed and were otherwise negative with the exception of those mentioned in the HPI and as above.    PHYSICAL EXAM: Filed Vitals:   11/23/12 1802  BP: 150/92  Pulse: 67  Temp: 97.9 F (36.6 C)  Resp: 17   Filed Vitals:   11/23/12  1802  Height: 5\' 9"  (1.753 m)  Weight: 177 lb (80.287 kg)   Body mass index is 26.14 kg/(m^2).  General: Alert, no acute distress HEENT:  Normocephalic, atraumatic, oropharynx patent. TM nl. No exudates. + sinus tenderness. Cardiovascular:  Regular rate and rhythm, no rubs murmurs or gallops.  No Carotid bruits, radial pulse intact. No pedal edema.  Respiratory: Clear to auscultation bilaterally.  No wheezes, rales, or rhonchi.  No cyanosis, no use of accessory musculature GI: No organomegaly, abdomen is soft and non-tender, positive bowel sounds.  No masses. Skin: No rashes. Neurologic: Facial musculature symmetric. Psychiatric: Patient is appropriate throughout our interaction. Lymphatic: No  cervical lymphadenopathy Musculoskeletal: Gait intact.   LABS: No results found for this or any previous visit.   EKG/XRAY:   Primary read interpreted by Dr. Conley Rolls at [facility].   ASSESSMENT/PLAN: Encounter Diagnoses  Name Primary?  . Sinusitis Yes  . Bronchitis   . Cough    Rx Augmentin and Tessalon Perles Monitor BP F/u prn    Roger Fasnacht PHUONG, DO 11/23/2012 7:35 PM

## 2012-11-27 DIAGNOSIS — M722 Plantar fascial fibromatosis: Secondary | ICD-10-CM | POA: Diagnosis not present

## 2013-01-14 DIAGNOSIS — M545 Low back pain, unspecified: Secondary | ICD-10-CM | POA: Diagnosis not present

## 2013-01-14 DIAGNOSIS — I10 Essential (primary) hypertension: Secondary | ICD-10-CM | POA: Diagnosis not present

## 2013-01-14 DIAGNOSIS — Z0000 Encounter for general adult medical examination without abnormal findings: Secondary | ICD-10-CM | POA: Diagnosis not present

## 2013-01-14 DIAGNOSIS — K219 Gastro-esophageal reflux disease without esophagitis: Secondary | ICD-10-CM | POA: Diagnosis not present

## 2013-03-24 DIAGNOSIS — R040 Epistaxis: Secondary | ICD-10-CM | POA: Diagnosis not present

## 2013-03-24 DIAGNOSIS — H1130 Conjunctival hemorrhage, unspecified eye: Secondary | ICD-10-CM | POA: Diagnosis not present

## 2013-05-18 ENCOUNTER — Other Ambulatory Visit: Payer: Self-pay | Admitting: Family Medicine

## 2013-07-17 DIAGNOSIS — Z23 Encounter for immunization: Secondary | ICD-10-CM | POA: Diagnosis not present

## 2013-07-17 DIAGNOSIS — R220 Localized swelling, mass and lump, head: Secondary | ICD-10-CM | POA: Diagnosis not present

## 2013-07-17 DIAGNOSIS — I10 Essential (primary) hypertension: Secondary | ICD-10-CM | POA: Diagnosis not present

## 2013-07-17 DIAGNOSIS — M545 Low back pain, unspecified: Secondary | ICD-10-CM | POA: Diagnosis not present

## 2013-07-17 DIAGNOSIS — K219 Gastro-esophageal reflux disease without esophagitis: Secondary | ICD-10-CM | POA: Diagnosis not present

## 2013-07-21 ENCOUNTER — Other Ambulatory Visit: Payer: Self-pay | Admitting: Family Medicine

## 2013-07-21 DIAGNOSIS — R220 Localized swelling, mass and lump, head: Secondary | ICD-10-CM

## 2013-07-24 ENCOUNTER — Ambulatory Visit
Admission: RE | Admit: 2013-07-24 | Discharge: 2013-07-24 | Disposition: A | Discharge status: Home / Self Care | Payer: Medicare Other | Source: Ambulatory Visit | Attending: Family Medicine | Admitting: Family Medicine

## 2013-07-24 DIAGNOSIS — E042 Nontoxic multinodular goiter: Secondary | ICD-10-CM | POA: Diagnosis not present

## 2013-07-24 DIAGNOSIS — R220 Localized swelling, mass and lump, head: Secondary | ICD-10-CM

## 2013-08-04 ENCOUNTER — Other Ambulatory Visit: Payer: Self-pay | Admitting: Family Medicine

## 2013-08-04 DIAGNOSIS — E041 Nontoxic single thyroid nodule: Secondary | ICD-10-CM

## 2013-08-04 DIAGNOSIS — E042 Nontoxic multinodular goiter: Secondary | ICD-10-CM

## 2013-08-05 ENCOUNTER — Other Ambulatory Visit: Payer: Self-pay | Admitting: Family Medicine

## 2013-08-05 DIAGNOSIS — E042 Nontoxic multinodular goiter: Secondary | ICD-10-CM

## 2013-08-05 DIAGNOSIS — I10 Essential (primary) hypertension: Secondary | ICD-10-CM | POA: Diagnosis not present

## 2013-08-06 ENCOUNTER — Ambulatory Visit
Admission: RE | Admit: 2013-08-06 | Discharge: 2013-08-06 | Disposition: A | Discharge status: Home / Self Care | Payer: Medicare Other | Source: Ambulatory Visit | Attending: Family Medicine | Admitting: Family Medicine

## 2013-08-06 ENCOUNTER — Other Ambulatory Visit (HOSPITAL_COMMUNITY)
Admission: RE | Admit: 2013-08-06 | Discharge: 2013-08-06 | Disposition: A | Discharge status: Home / Self Care | Payer: Medicare Other | Source: Ambulatory Visit | Attending: Interventional Radiology | Admitting: Interventional Radiology

## 2013-08-06 DIAGNOSIS — E041 Nontoxic single thyroid nodule: Secondary | ICD-10-CM | POA: Insufficient documentation

## 2013-08-06 DIAGNOSIS — D449 Neoplasm of uncertain behavior of unspecified endocrine gland: Secondary | ICD-10-CM | POA: Diagnosis not present

## 2013-08-06 DIAGNOSIS — E042 Nontoxic multinodular goiter: Secondary | ICD-10-CM

## 2013-08-06 DIAGNOSIS — E0789 Other specified disorders of thyroid: Secondary | ICD-10-CM | POA: Diagnosis not present

## 2013-10-09 DIAGNOSIS — K5900 Constipation, unspecified: Secondary | ICD-10-CM | POA: Diagnosis not present

## 2013-10-09 DIAGNOSIS — K5732 Diverticulitis of large intestine without perforation or abscess without bleeding: Secondary | ICD-10-CM | POA: Diagnosis not present

## 2013-12-06 ENCOUNTER — Ambulatory Visit (INDEPENDENT_AMBULATORY_CARE_PROVIDER_SITE_OTHER): Payer: Medicare Other | Admitting: Internal Medicine

## 2013-12-06 VITALS — BP 140/80 | HR 70 | Temp 97.9°F | Resp 18 | Ht 69.0 in | Wt 178.5 lb

## 2013-12-06 DIAGNOSIS — J019 Acute sinusitis, unspecified: Secondary | ICD-10-CM

## 2013-12-06 MED ORDER — AMOXICILLIN-POT CLAVULANATE 875-125 MG PO TABS: 1.0000 | ORAL_TABLET | Freq: Two times a day (BID) | ORAL | Status: DC

## 2013-12-06 MED ORDER — BENZONATATE 100 MG PO CAPS: 100.0000 mg | ORAL_CAPSULE | Freq: Two times a day (BID) | ORAL | Status: DC | PRN

## 2013-12-06 NOTE — Progress Notes (Signed)
This chart was scribed for Tami Lin, MD by Einar Pheasant, ED Scribe. This patient was seen in room 4 and the patient's care was started at 1:06 PM. Subjective:    Patient ID: Kevin Mcintyre, male    DOB: 1933/12/01, 80 y.o.   MRN: [id number] Chief Complaint  Patient presents with  . Allergies    sneezing, pnd, throat clearing & runny nose off & on for a few months, worse recently    HPI HPI Comments: Kevin Mcintyre is a 80 y.o. male who has a history of thyroid problems presents [facility] complaining of rhinorrhea that started few months ago. Pt is also complaining of having to clear his throat a lot during the coarse of the day. He denies any use of OTC medication to relieve his symptoms. Pt states that his symptoms worsen at night. Pt states that he was seen here in the office by Dr. Truman Hayward with similar symptoms and he was prescribed Augmentin and Tessalon Pearls. He is requesting the same regiment. Denies cough, fever, chills, or diaphoresis.   Patient Active Problem List   Diagnosis Date Noted  . HTN (hypertension) 11/01/2012   Past Medical History  Diagnosis Date  . Hypertension   . Back problem   . GERD (gastroesophageal reflux disease)    Past Surgical History  Procedure Laterality Date  . Hernia repair    . Spine surgery     No Known Allergies Prior to Admission medications   Medication Sig Start Date End Date Taking? Authorizing Provider  amLODipine (NORVASC) 5 MG tablet Take 5 mg by mouth daily.   Yes Historical Provider, MD  aspirin 81 MG tablet Take 81 mg by mouth daily.   Yes Historical Provider, MD  celecoxib (CELEBREX) 200 MG capsule Take 200 mg by mouth as needed.    Yes Historical Provider, MD  fluticasone (FLONASE) 50 MCG/ACT nasal spray PLACE 2 SPRAYS INTO THE NOSE DAILY. 05/18/13  Yes Heather M Marte, PA-C  meloxicam (MOBIC) 15 MG tablet Take 15 mg by mouth daily.   Yes Historical Provider, MD  olmesartan (BENICAR) 20 MG  tablet Take 20 mg by mouth daily.   Yes Historical Provider, MD  pantoprazole (PROTONIX) 40 MG tablet Take 40 mg by mouth daily.   Yes Historical Provider, MD  ranitidine (ZANTAC) 300 MG tablet Take 300 mg by mouth at bedtime.   Yes Historical Provider, MD  traMADol (ULTRAM) 50 MG tablet Take 50 mg by mouth 2 (two) times daily.   Yes Historical Provider, MD   History   Social History  . Marital Status: Married    Spouse Name: N/A    Number of Children: N/A  . Years of Education: N/A   Occupational History  . Not on file.   Social History Main Topics  . Smoking status: Never Smoker   . Smokeless tobacco: Not on file  . Alcohol Use: No  . Drug Use: No  . Sexual Activity: Not on file   Other Topics Concern  . Not on file   Social History Narrative  . No narrative on file     Review of Systems A complete 10 system review of systems was obtained and all systems are negative except as noted in the HPI and PMH.      Objective:   Physical Exam  Nursing note and vitals reviewed. Constitutional: He is oriented to person, place, and time. He appears well-developed and well-nourished. No distress.  HENT:  Head: Normocephalic and atraumatic.  Right Ear: External ear normal.  Left Ear: External ear normal.  Mouth/Throat: Oropharynx is clear and moist.  Purulent discharge in his nose.  Eyes: Conjunctivae and EOM are normal. Pupils are equal, round, and reactive to light.  Neck: Neck supple. No tracheal deviation present.  Left thyroid nodule.  Cardiovascular: Normal rate, regular rhythm and normal heart sounds.   No murmur heard. Pulmonary/Chest: Effort normal. No respiratory distress.  Musculoskeletal: Normal range of motion.  Neurological: He is alert and oriented to person, place, and time.  Skin: Skin is warm and dry.  Psychiatric: He has a normal mood and affect. His behavior is normal.    Filed Vitals:   12/06/13 1241  BP: 140/80  Pulse: 70  Temp: 97.9 F (36.6 C)   TempSrc: Oral  Resp: 18  Height: 5\' 9"  (1.753 m)  Weight: 178 lb 8 oz (80.967 kg)  SpO2: 97%        Assessment & Plan:  Acute sinusitis, unspecified  Meds ordered this encounter  Medications  . amoxicillin-clavulanate (AUGMENTIN) 875-125 MG per tablet    Sig: Take 1 tablet by mouth 2 (two) times daily.    Dispense:  20 tablet    Refill:  0  . benzonatate (TESSALON) 100 MG capsule    Sig: Take 1 capsule (100 mg total) by mouth 2 (two) times daily as needed for cough.    Dispense:  20 capsule    Refill:  0   Followup one week if not well

## 2013-12-12 ENCOUNTER — Other Ambulatory Visit: Payer: Self-pay | Admitting: Physician Assistant

## 2014-02-05 DIAGNOSIS — M545 Low back pain, unspecified: Secondary | ICD-10-CM | POA: Diagnosis not present

## 2014-02-05 DIAGNOSIS — I10 Essential (primary) hypertension: Secondary | ICD-10-CM | POA: Diagnosis not present

## 2014-02-05 DIAGNOSIS — K219 Gastro-esophageal reflux disease without esophagitis: Secondary | ICD-10-CM | POA: Diagnosis not present

## 2014-07-14 ENCOUNTER — Other Ambulatory Visit: Payer: Self-pay | Admitting: Physician Assistant

## 2014-08-13 DIAGNOSIS — M545 Low back pain: Secondary | ICD-10-CM | POA: Diagnosis not present

## 2014-08-13 DIAGNOSIS — Z0000 Encounter for general adult medical examination without abnormal findings: Secondary | ICD-10-CM | POA: Diagnosis not present

## 2014-08-13 DIAGNOSIS — I10 Essential (primary) hypertension: Secondary | ICD-10-CM | POA: Diagnosis not present

## 2014-08-13 DIAGNOSIS — Z23 Encounter for immunization: Secondary | ICD-10-CM | POA: Diagnosis not present

## 2014-08-13 DIAGNOSIS — Z1389 Encounter for screening for other disorder: Secondary | ICD-10-CM | POA: Diagnosis not present

## 2014-08-13 DIAGNOSIS — K219 Gastro-esophageal reflux disease without esophagitis: Secondary | ICD-10-CM | POA: Diagnosis not present

## 2014-10-29 ENCOUNTER — Ambulatory Visit (INDEPENDENT_AMBULATORY_CARE_PROVIDER_SITE_OTHER): Payer: Medicare Other | Admitting: Sports Medicine

## 2014-10-29 VITALS — BP 118/80 | HR 66 | Temp 99.4°F | Resp 16 | Ht 68.5 in | Wt 180.8 lb

## 2014-10-29 DIAGNOSIS — J069 Acute upper respiratory infection, unspecified: Secondary | ICD-10-CM

## 2014-10-29 NOTE — Progress Notes (Signed)
  Kevin Mcintyre - 81 y.o. male MRN [id number]  Date of birth: 12/31/1933  CC & HPI:  Chief Complaint  Patient presents with  . Cough    yellow mucous.  started 3 days ago  . congestion    Establish patient with new complaint: Cough, congestion, facial pressure: Patient reports 3-4 days of cough, congestion that is essentially unchanged over the past 2-3 days. He's been taking TheraFlu, emergency and occasional Motrin without significant improvement. Uses Flonase on a daily basis He began having productive yellow mucus on occasion. Denies any respiratory difficulty, shortness of breath, orthopnea, chest pain. Reports occasional subjective fevers but no objective temperature is noted.   ROS:  Per HPI.   HISTORY: Past Medical, Surgical, Social, and Family History Reviewed & Updated per EMR.  Pertinent Historical Findings include: Allergic rhinitis on Flonase, Hypertension well controlled, GERD Nonsmoker Prior anterior cervical decompression and fusion  OBJECTIVE:  VS:   HT:5' 8.5" (174 cm)   WT:180 lb 12.8 oz (82.01 kg)  BMI:27.1          BP:118/80 mmHg  HR:66bpm  TEMP:99.4 F (37.4 C)(Oral)  RESP:95 %  PHYSICAL EXAM: GENERAL:  adult African-American male. In no discomfort; no respiratory distress   PSYCH: alert and appropriate, good insight   HNEENT: Right TM with small serous effusion Nasal mucosa erythematous and ID with exudate Oropharynx moist with mild posterior oropharyngeal streaking erythema, no significant tonsillar hypertrophy or exudate  No significant cervical lymphadenopathy   CARDIAC: RRR, S1/S2 heard, no murmur  LUNGS: CTA B, no wheezes, no crackles  ABDOMEN:   EXTREM: Warm, well perfused.  Moves all 4 extremities spontaneously; no lateralization.      ASSESSMENT: 1. Acute upper respiratory infection    likely viral URI, no evidence for acute bacterial infection, no antibiotics indicated.  PLAN: See problem based charting & AVS for additional  documentation.  Discussed symptomatic treatment per AVS  Cautioned on red flags > Return if symptoms worsen or fail to improve.

## 2014-10-29 NOTE — Patient Instructions (Signed)
You have viral infection that will resolve on its own over time.  Typically, symptoms can last for up to 10 days (or more) but should be continually improving after the 5th day.  If you exerpience worsening after the 7th day please call us back.  Please continue to drink plenty of fluids and remember you and everybody in your household need to wash their hands frequently!  Take Aleeve (Naproxen) 2 tablets twice per day with food (breakfast and dinner)  Honey has been shown to help with cough.  You can try mixing  a teaspoon of honey in warm water or caffeine free herbal tea before bedtime. We don't know why, but chicken soup also helps, try it!  Keep using your Flonase but be sure to use it after you use the NASAL SALINE.  NASAL SALINE - you can try either pre-made saline, or a self mix option such as a Netty Pot or Sinus Rinse by [facility].  These are available in your pharmacy. Use the Nasal saline at least 2-3 times per day and be sure to use it before you use the Flonase.  Upper Respiratory Infection, Adult An upper respiratory infection (URI) is also sometimes known as the common cold. The upper respiratory tract includes the nose, sinuses, throat, trachea, and bronchi. Bronchi are the airways leading to the lungs. Most people improve within 1 week, but symptoms can last up to 2 weeks. A residual cough may last even longer.  CAUSES Many different viruses can infect the tissues lining the upper respiratory tract. The tissues become irritated and inflamed and often become very moist. Mucus production is also common. A cold is contagious. You can easily spread the virus to others by oral contact. This includes kissing, sharing a glass, coughing, or sneezing. Touching your mouth or nose and then touching a surface, which is then touched by another person, can also spread the virus. SYMPTOMS  Symptoms typically develop 1 to 3 days after you come in contact with a cold virus. Symptoms vary from  person to person. They may include:  Runny nose.  Sneezing.  Nasal congestion.  Sinus irritation.  Sore throat.  Loss of voice (laryngitis).  Cough.  Fatigue.  Muscle aches.  Loss of appetite.  Headache.  Low-grade fever. DIAGNOSIS  You might diagnose your own cold based on familiar symptoms, since most people get a cold 2 to 3 times a year. Your caregiver can confirm this based on your exam. Most importantly, your caregiver can check that your symptoms are not due to another disease such as strep throat, sinusitis, pneumonia, asthma, or epiglottitis. Blood tests, throat tests, and X-rays are not necessary to diagnose a common cold, but they may sometimes be helpful in excluding other more serious diseases. Your caregiver will decide if any further tests are required. RISKS AND COMPLICATIONS  You may be at risk for a more severe case of the common cold if you smoke cigarettes, have chronic heart disease (such as heart failure) or lung disease (such as asthma), or if you have a weakened immune system. The very young and very old are also at risk for more serious infections. Bacterial sinusitis, middle ear infections, and bacterial pneumonia can complicate the common cold. The common cold can worsen asthma and chronic obstructive pulmonary disease (COPD). Sometimes, these complications can require emergency medical care and may be life-threatening. PREVENTION  The best way to protect against getting a cold is to practice good hygiene. Avoid oral or hand contact  with people with cold symptoms. Wash your hands often if contact occurs. There is no clear evidence that vitamin C, vitamin E, echinacea, or exercise reduces the chance of developing a cold. However, it is always recommended to get plenty of rest and practice good nutrition. TREATMENT  Treatment is directed at relieving symptoms. There is no cure. Antibiotics are not effective, because the infection is caused by a virus, not by  bacteria. Treatment may include:  Increased fluid intake. Sports drinks offer valuable electrolytes, sugars, and fluids.  Breathing heated mist or steam (vaporizer or shower).  Eating chicken soup or other clear broths, and maintaining good nutrition.  Getting plenty of rest.  Using gargles or lozenges for comfort.  Controlling fevers with ibuprofen or acetaminophen as directed by your caregiver.  Increasing usage of your inhaler if you have asthma. Zinc gel and zinc lozenges, taken in the first 24 hours of the common cold, can shorten the duration and lessen the severity of symptoms. Pain medicines may help with fever, muscle aches, and throat pain. A variety of non-prescription medicines are available to treat congestion and runny nose. Your caregiver can make recommendations and may suggest nasal or lung inhalers for other symptoms.  HOME CARE INSTRUCTIONS   Only take over-the-counter or prescription medicines for pain, discomfort, or fever as directed by your caregiver.  Use a warm mist humidifier or inhale steam from a shower to increase air moisture. This may keep secretions moist and make it easier to breathe.  Drink enough water and fluids to keep your urine clear or pale yellow.  Rest as needed.  Return to work when your temperature has returned to normal or as your caregiver advises. You may need to stay home longer to avoid infecting others. You can also use a face mask and careful hand washing to prevent spread of the virus. SEEK MEDICAL CARE IF:   After the first few days, you feel you are getting worse rather than better.  You need your caregiver's advice about medicines to control symptoms.  You develop chills, worsening shortness of breath, or brown or red sputum. These may be signs of pneumonia.  You develop yellow or brown nasal discharge or pain in the face, especially when you bend forward. These may be signs of sinusitis.  You develop a fever, swollen neck  glands, pain with swallowing, or white areas in the back of your throat. These may be signs of strep throat. SEEK IMMEDIATE MEDICAL CARE IF:   You have a fever.  You develop severe or persistent headache, ear pain, sinus pain, or chest pain.  You develop wheezing, a prolonged cough, cough up blood, or have a change in your usual mucus (if you have chronic lung disease).  You develop sore muscles or a stiff neck. Document Released: 04/04/2001 Document Revised: 01/01/2012 Document Reviewed: 01/14/2014 [facility] Patient Information 2015 [place], [place]. This information is not intended to replace advice given to you by your health care provider. Make sure you discuss any questions you have with your health care provider.

## 2014-10-31 NOTE — Progress Notes (Signed)
History and physical examinations reviewed in detail. Agree with Assessment and plan as outlined by Dr. Paulla Fore. Kristi Elayne Guerin, M.D. [facility] [address] [place], [place]  [id number] [phone] phone [phone] fax

## 2014-11-06 ENCOUNTER — Ambulatory Visit (INDEPENDENT_AMBULATORY_CARE_PROVIDER_SITE_OTHER): Payer: Medicare Other | Admitting: Emergency Medicine

## 2014-11-06 VITALS — BP 124/76 | HR 84 | Temp 98.0°F | Resp 17 | Ht 67.5 in | Wt 178.0 lb

## 2014-11-06 DIAGNOSIS — J0101 Acute recurrent maxillary sinusitis: Secondary | ICD-10-CM | POA: Diagnosis not present

## 2014-11-06 MED ORDER — AMOXICILLIN-POT CLAVULANATE 875-125 MG PO TABS: 1.0000 | ORAL_TABLET | Freq: Two times a day (BID) | ORAL | Status: DC

## 2014-11-06 NOTE — Patient Instructions (Signed)

## 2014-11-06 NOTE — Progress Notes (Signed)
   Subjective:    Patient ID: Kevin Mcintyre, male    DOB: 05-08-1934, 81 y.o.   MRN: [id number] This chart was scribed for Arlyss Queen, MD by Marti Sleigh, Medical Scribe. This patient was seen in Room 10 and the patient's care was started at 8:27 AM.  Chief Complaint  Patient presents with  . Laryngitis  . Sore Throat  . URI    HPI HPI Comments: Kevin Mcintyre is a 81 y.o. male who presents to [facility] complaining of continued productive cough with dark green drainage for the last ten days. Pt reported to [facility] eight days ago for the same sx and was prescribed symptomatic treatment, including aleve and mucinex. Pt has also taken theraflu and EmergenC PTA with minimal relief. PT endorses associated voice disturbance. Pt states that he doesn't feel bad, other than feeling congested. Pt denies appetite disturbance. Pt uses Flonase on a daily basis.   Review of Systems  Constitutional: Negative for fever, chills and fatigue.  HENT: Positive for congestion, rhinorrhea and sinus pressure.   Respiratory: Positive for cough.   Gastrointestinal: Negative for nausea and vomiting.       Objective:   Physical Exam  Constitutional: He is oriented to person, place, and time. He appears well-developed and well-nourished.  HENT:  Head: Normocephalic and atraumatic.  Crusting with purulent material in the nares, bilaterally.   Eyes: Pupils are equal, round, and reactive to light.  Neck: Neck supple.  Cardiovascular: Normal rate, regular rhythm and normal heart sounds.   No murmur heard. Pulmonary/Chest: Effort normal and breath sounds normal. No respiratory distress.  Good air exchange with rare rhonchi, no wheezes or rales.   Neurological: He is alert and oriented to person, place, and time.  Skin: Skin is warm and dry.  Psychiatric: He has a normal mood and affect. His behavior is normal.  Nursing note and vitals reviewed.  Meds ordered this encounter  Medications  .  amoxicillin-clavulanate (AUGMENTIN) 875-125 MG per tablet    Sig: Take 1 tablet by mouth 2 (two) times daily.    Dispense:  20 tablet    Refill:  0       Assessment & Plan:  We'll treat patient has an acute maxillary sinusitis with Augmentin. He is taken this antibiotic before with good results.

## 2015-01-29 ENCOUNTER — Other Ambulatory Visit: Payer: Self-pay | Admitting: Physician Assistant

## 2015-02-15 DIAGNOSIS — I10 Essential (primary) hypertension: Secondary | ICD-10-CM | POA: Diagnosis not present

## 2015-02-15 DIAGNOSIS — K219 Gastro-esophageal reflux disease without esophagitis: Secondary | ICD-10-CM | POA: Diagnosis not present

## 2015-02-15 DIAGNOSIS — M545 Low back pain: Secondary | ICD-10-CM | POA: Diagnosis not present

## 2015-03-03 ENCOUNTER — Other Ambulatory Visit: Payer: Self-pay | Admitting: Family Medicine

## 2015-03-20 ENCOUNTER — Ambulatory Visit (INDEPENDENT_AMBULATORY_CARE_PROVIDER_SITE_OTHER): Payer: Medicare Other | Admitting: Family Medicine

## 2015-03-20 VITALS — BP 130/80 | HR 65 | Temp 97.6°F | Resp 18 | Ht 67.75 in | Wt 183.0 lb

## 2015-03-20 DIAGNOSIS — J329 Chronic sinusitis, unspecified: Secondary | ICD-10-CM | POA: Diagnosis not present

## 2015-03-20 DIAGNOSIS — R05 Cough: Secondary | ICD-10-CM

## 2015-03-20 DIAGNOSIS — J069 Acute upper respiratory infection, unspecified: Secondary | ICD-10-CM

## 2015-03-20 DIAGNOSIS — R059 Cough, unspecified: Secondary | ICD-10-CM

## 2015-03-20 MED ORDER — AMOXICILLIN-POT CLAVULANATE 875-125 MG PO TABS: 1.0000 | ORAL_TABLET | Freq: Two times a day (BID) | ORAL | Status: DC

## 2015-03-20 MED ORDER — BENZONATATE 100 MG PO CAPS: 100.0000 mg | ORAL_CAPSULE | Freq: Three times a day (TID) | ORAL | Status: DC | PRN

## 2015-03-20 NOTE — Progress Notes (Signed)
Subjective:  This chart was scribed for Kevin Ray, MD by [facility], medical scribe at [facility].The patient was seen in exam room 05 and the patient's care was started at 9:24 AM.   Patient ID: Kevin Mcintyre, male    DOB: 01-04-1934, 81 y.o.   MRN: [id number] Chief Complaint  Patient presents with  . URI    with head congestion and cough x's 1 week   HPI HPI Comments: Kevin Mcintyre is a 81 y.o. male who presents to [facility] complaining of chest congestion and a cough for one week. His cough is producing a green phlegm. Congestion has traveled to his head. Some shortness of breath as associated symptoms. Chest congestion has improved but nasal congestion has is still present. Sick contacts, wife has similar symptoms. He has taken Flonase, mucienx and Theraflu. Pt is able to sleep. He was seen here several times for sinus congestion He denies fever.   Patient Active Problem List   Diagnosis Date Noted  . Acute upper respiratory infection 10/29/2014  . HTN (hypertension) 11/01/2012   Past Medical History  Diagnosis Date  . Hypertension   . Back problem   . GERD (gastroesophageal reflux disease)    Past Surgical History  Procedure Laterality Date  . Hernia repair    . Spine surgery     No Known Allergies Prior to Admission medications   Medication Sig Start Date End Date Taking? Authorizing Provider  amLODipine (NORVASC) 5 MG tablet Take 5 mg by mouth daily.   Yes Historical Provider, MD  aspirin 81 MG tablet Take 81 mg by mouth daily.   Yes Historical Provider, MD  fluticasone (FLONASE) 50 MCG/ACT nasal spray PLACE 2 SPRAYS INTO THE NOSE DAILY. "OV NEEDED FOR ADDITIONAL REFILLS" 03/04/15  Yes Chelle Jeffery, PA-C  meloxicam (MOBIC) 15 MG tablet Take 15 mg by mouth daily.   Yes Historical Provider, MD  olmesartan (BENICAR) 20 MG tablet Take 20 mg by mouth daily.   Yes Historical Provider, MD  pantoprazole (PROTONIX) 40 MG  tablet Take 40 mg by mouth daily.   Yes Historical Provider, MD   History   Social History  . Marital Status: Married    Spouse Name: N/A  . Number of Children: N/A  . Years of Education: N/A   Occupational History  . Not on file.   Social History Main Topics  . Smoking status: Never Smoker   . Smokeless tobacco: Not on file  . Alcohol Use: No  . Drug Use: No  . Sexual Activity: Not on file   Other Topics Concern  . Not on file   Social History Narrative   Review of Systems  Constitutional: Negative for fatigue and unexpected weight change.  HENT: Positive for congestion, rhinorrhea and sinus pressure.   Eyes: Negative for visual disturbance.  Respiratory: Positive for cough and shortness of breath. Negative for chest tightness.   Cardiovascular: Negative for chest pain, palpitations and leg swelling.  Gastrointestinal: Negative for abdominal pain and blood in stool.  Neurological: Negative for dizziness, light-headedness and headaches.     Objective:  BP 130/8 mmHg  Pulse 65  Temp(Src) 97.6 F (36.4 C) (Oral)  Ht 5' 7.75" (1.721 m)  Wt 183 lb (83.008 kg)  BMI 28.03 kg/m2  SpO2 97% Physical Exam  Constitutional: He is oriented to person, place, and time. He appears well-developed and well-nourished.  HENT:  Head: Normocephalic and atraumatic.  Right  Ear: Tympanic membrane, external ear and ear canal normal.  Left Ear: Tympanic membrane, external ear and ear canal normal.  Nose: No rhinorrhea.  Mouth/Throat: Oropharynx is clear and moist and mucous membranes are normal. No oropharyngeal exudate or posterior oropharyngeal erythema.  Tympanic membranes are pearly grey. Slight boggy turbinates on the left, clear rhinorrhea.  Eyes: Conjunctivae and EOM are normal. Pupils are equal, round, and reactive to light.  Neck: Neck supple. No JVD present. Carotid bruit is not present.  Cardiovascular: Normal rate, regular rhythm, normal heart sounds and intact distal pulses.    No murmur heard. Pulmonary/Chest: Effort normal and breath sounds normal. He has no wheezes. He has no rhonchi. He has no rales.  Abdominal: Soft. There is no tenderness.  Musculoskeletal: He exhibits no edema.  Lymphadenopathy:    He has no cervical adenopathy.  Neurological: He is alert and oriented to person, place, and time.  Skin: Skin is warm and dry. No rash noted.  Psychiatric: He has a normal mood and affect. His behavior is normal.  Vitals reviewed.    Assessment & Plan:   Kevin Mcintyre is a 81 y.o. male Cough  Acute upper respiratory infection - Plan: amoxicillin-clavulanate (AUGMENTIN) 875-125 MG per tablet  Recurrent sinus infections - Plan: amoxicillin-clavulanate (AUGMENTIN) 875-125 MG per tablet  Viral URI likely, but hx of sinusitis. Discussed that sx's do not appear to be bacterial in nature at this time.  Tessalon or mucinex DM for cough, saline NS, and if increased pain or sinus sx's next week (or not improving into next week) - Rx Augmentin given. rtc precautions.   Meds ordered this encounter  Medications  . benzonatate (TESSALON) 100 MG capsule    Sig: Take 1 capsule (100 mg total) by mouth 3 (three) times daily as needed for cough.    Dispense:  20 capsule    Refill:  0  . amoxicillin-clavulanate (AUGMENTIN) 875-125 MG per tablet    Sig: Take 1 tablet by mouth 2 (two) times daily.    Dispense:  20 tablet    Refill:  0   Patient Instructions  Saline nasal spray atleast 4 times per day, over the counter mucinex DM or tessalon if needed for cough, drink plenty of fluids.  If not improving into next week  (especially if snus pressure/pain or discolored nasal discharge), you can fill the augmentin. Return to the clinic or go to the nearest emergency room if any of your symptoms worsen or new symptoms occur.  Upper Respiratory Infection, Adult An upper respiratory infection (URI) is also sometimes known as the common cold. The upper respiratory tract  includes the nose, sinuses, throat, trachea, and bronchi. Bronchi are the airways leading to the lungs. Most people improve within 1 week, but symptoms can last up to 2 weeks. A residual cough may last even longer.  CAUSES Many different viruses can infect the tissues lining the upper respiratory tract. The tissues become irritated and inflamed and often become very moist. Mucus production is also common. A cold is contagious. You can easily spread the virus to others by oral contact. This includes kissing, sharing a glass, coughing, or sneezing. Touching your mouth or nose and then touching a surface, which is then touched by another person, can also spread the virus. SYMPTOMS  Symptoms typically develop 1 to 3 days after you come in contact with a cold virus. Symptoms vary from person to person. They may include:  Runny nose.  Sneezing.  Nasal congestion.  Sinus irritation.  Sore throat.  Loss of voice (laryngitis).  Cough.  Fatigue.  Muscle aches.  Loss of appetite.  Headache.  Low-grade fever. DIAGNOSIS  You might diagnose your own cold based on familiar symptoms, since most people get a cold 2 to 3 times a year. Your caregiver can confirm this based on your exam. Most importantly, your caregiver can check that your symptoms are not due to another disease such as strep throat, sinusitis, pneumonia, asthma, or epiglottitis. Blood tests, throat tests, and X-rays are not necessary to diagnose a common cold, but they may sometimes be helpful in excluding other more serious diseases. Your caregiver will decide if any further tests are required. RISKS AND COMPLICATIONS  You may be at risk for a more severe case of the common cold if you smoke cigarettes, have chronic heart disease (such as heart failure) or lung disease (such as asthma), or if you have a weakened immune system. The very young and very old are also at risk for more serious infections. Bacterial sinusitis, middle ear  infections, and bacterial pneumonia can complicate the common cold. The common cold can worsen asthma and chronic obstructive pulmonary disease (COPD). Sometimes, these complications can require emergency medical care and may be life-threatening. PREVENTION  The best way to protect against getting a cold is to practice good hygiene. Avoid oral or hand contact with people with cold symptoms. Wash your hands often if contact occurs. There is no clear evidence that vitamin C, vitamin E, echinacea, or exercise reduces the chance of developing a cold. However, it is always recommended to get plenty of rest and practice good nutrition. TREATMENT  Treatment is directed at relieving symptoms. There is no cure. Antibiotics are not effective, because the infection is caused by a virus, not by bacteria. Treatment may include:  Increased fluid intake. Sports drinks offer valuable electrolytes, sugars, and fluids.  Breathing heated mist or steam (vaporizer or shower).  Eating chicken soup or other clear broths, and maintaining good nutrition.  Getting plenty of rest.  Using gargles or lozenges for comfort.  Controlling fevers with ibuprofen or acetaminophen as directed by your caregiver.  Increasing usage of your inhaler if you have asthma. Zinc gel and zinc lozenges, taken in the first 24 hours of the common cold, can shorten the duration and lessen the severity of symptoms. Pain medicines may help with fever, muscle aches, and throat pain. A variety of non-prescription medicines are available to treat congestion and runny nose. Your caregiver can make recommendations and may suggest nasal or lung inhalers for other symptoms.  HOME CARE INSTRUCTIONS   Only take over-the-counter or prescription medicines for pain, discomfort, or fever as directed by your caregiver.  Use a warm mist humidifier or inhale steam from a shower to increase air moisture. This may keep secretions moist and make it easier to  breathe.  Drink enough water and fluids to keep your urine clear or pale yellow.  Rest as needed.  Return to work when your temperature has returned to normal or as your caregiver advises. You may need to stay home longer to avoid infecting others. You can also use a face mask and careful hand washing to prevent spread of the virus. SEEK MEDICAL CARE IF:   After the first few days, you feel you are getting worse rather than better.  You need your caregiver's advice about medicines to control symptoms.  You develop chills, worsening shortness of breath, or brown or red sputum. These  may be signs of pneumonia.  You develop yellow or brown nasal discharge or pain in the face, especially when you bend forward. These may be signs of sinusitis.  You develop a fever, swollen neck glands, pain with swallowing, or white areas in the back of your throat. These may be signs of strep throat. SEEK IMMEDIATE MEDICAL CARE IF:   You have a fever.  You develop severe or persistent headache, ear pain, sinus pain, or chest pain.  You develop wheezing, a prolonged cough, cough up blood, or have a change in your usual mucus (if you have chronic lung disease).  You develop sore muscles or a stiff neck. Document Released: 04/04/2001 Document Revised: 01/01/2012 Document Reviewed: 01/14/2014 [facility] Patient Information 2015 [place], [place]. This information is not intended to replace advice given to you by your health care provider. Make sure you discuss any questions you have with your health care provider.  Cough, Adult  A cough is a reflex that helps clear your throat and airways. It can help heal the body or may be a reaction to an irritated airway. A cough may only last 2 or 3 weeks (acute) or may last more than 8 weeks (chronic).  CAUSES Acute cough:  Viral or bacterial infections. Chronic cough:  Infections.  Allergies.  Asthma.  Post-nasal drip.  Smoking.  Heartburn or acid  reflux.  Some medicines.  Chronic lung problems (COPD).  Cancer. SYMPTOMS   Cough.  Fever.  Chest pain.  Increased breathing rate.  High-pitched whistling sound when breathing (wheezing).  Colored mucus that you cough up (sputum). TREATMENT   A bacterial cough may be treated with antibiotic medicine.  A viral cough must run its course and will not respond to antibiotics.  Your caregiver may recommend other treatments if you have a chronic cough. HOME CARE INSTRUCTIONS   Only take over-the-counter or prescription medicines for pain, discomfort, or fever as directed by your caregiver. Use cough suppressants only as directed by your caregiver.  Use a cold steam vaporizer or humidifier in your bedroom or home to help loosen secretions.  Sleep in a semi-upright position if your cough is worse at night.  Rest as needed.  Stop smoking if you smoke. SEEK IMMEDIATE MEDICAL CARE IF:   You have pus in your sputum.  Your cough starts to worsen.  You cannot control your cough with suppressants and are losing sleep.  You begin coughing up blood.  You have difficulty breathing.  You develop pain which is getting worse or is uncontrolled with medicine.  You have a fever. MAKE SURE YOU:   Understand these instructions.  Will watch your condition.  Will get help right away if you are not doing well or get worse. Document Released: 04/07/2011 Document Revised: 01/01/2012 Document Reviewed: 04/07/2011 [facility] Patient Information 2015 [place], [place]. This information is not intended to replace advice given to you by your health care provider. Make sure you discuss any questions you have with your health care provider.     I personally performed the services described in this documentation, which was scribed in my presence. The recorded information has been reviewed and considered, and addended by me as needed.

## 2015-03-20 NOTE — Patient Instructions (Signed)
Saline nasal spray atleast 4 times per day, over the counter mucinex DM or tessalon if needed for cough, drink plenty of fluids.  If not improving into next week  (especially if snus pressure/pain or discolored nasal discharge), you can fill the augmentin. Return to the clinic or go to the nearest emergency room if any of your symptoms worsen or new symptoms occur.  Upper Respiratory Infection, Adult An upper respiratory infection (URI) is also sometimes known as the common cold. The upper respiratory tract includes the nose, sinuses, throat, trachea, and bronchi. Bronchi are the airways leading to the lungs. Most people improve within 1 week, but symptoms can last up to 2 weeks. A residual cough may last even longer.  CAUSES Many different viruses can infect the tissues lining the upper respiratory tract. The tissues become irritated and inflamed and often become very moist. Mucus production is also common. A cold is contagious. You can easily spread the virus to others by oral contact. This includes kissing, sharing a glass, coughing, or sneezing. Touching your mouth or nose and then touching a surface, which is then touched by another person, can also spread the virus. SYMPTOMS  Symptoms typically develop 1 to 3 days after you come in contact with a cold virus. Symptoms vary from person to person. They may include:  Runny nose.  Sneezing.  Nasal congestion.  Sinus irritation.  Sore throat.  Loss of voice (laryngitis).  Cough.  Fatigue.  Muscle aches.  Loss of appetite.  Headache.  Low-grade fever. DIAGNOSIS  You might diagnose your own cold based on familiar symptoms, since most people get a cold 2 to 3 times a year. Your caregiver can confirm this based on your exam. Most importantly, your caregiver can check that your symptoms are not due to another disease such as strep throat, sinusitis, pneumonia, asthma, or epiglottitis. Blood tests, throat tests, and X-rays are not  necessary to diagnose a common cold, but they may sometimes be helpful in excluding other more serious diseases. Your caregiver will decide if any further tests are required. RISKS AND COMPLICATIONS  You may be at risk for a more severe case of the common cold if you smoke cigarettes, have chronic heart disease (such as heart failure) or lung disease (such as asthma), or if you have a weakened immune system. The very young and very old are also at risk for more serious infections. Bacterial sinusitis, middle ear infections, and bacterial pneumonia can complicate the common cold. The common cold can worsen asthma and chronic obstructive pulmonary disease (COPD). Sometimes, these complications can require emergency medical care and may be life-threatening. PREVENTION  The best way to protect against getting a cold is to practice good hygiene. Avoid oral or hand contact with people with cold symptoms. Wash your hands often if contact occurs. There is no clear evidence that vitamin C, vitamin E, echinacea, or exercise reduces the chance of developing a cold. However, it is always recommended to get plenty of rest and practice good nutrition. TREATMENT  Treatment is directed at relieving symptoms. There is no cure. Antibiotics are not effective, because the infection is caused by a virus, not by bacteria. Treatment may include:  Increased fluid intake. Sports drinks offer valuable electrolytes, sugars, and fluids.  Breathing heated mist or steam (vaporizer or shower).  Eating chicken soup or other clear broths, and maintaining good nutrition.  Getting plenty of rest.  Using gargles or lozenges for comfort.  Controlling fevers with ibuprofen or acetaminophen  as directed by your caregiver.  Increasing usage of your inhaler if you have asthma. Zinc gel and zinc lozenges, taken in the first 24 hours of the common cold, can shorten the duration and lessen the severity of symptoms. Pain medicines may help  with fever, muscle aches, and throat pain. A variety of non-prescription medicines are available to treat congestion and runny nose. Your caregiver can make recommendations and may suggest nasal or lung inhalers for other symptoms.  HOME CARE INSTRUCTIONS   Only take over-the-counter or prescription medicines for pain, discomfort, or fever as directed by your caregiver.  Use a warm mist humidifier or inhale steam from a shower to increase air moisture. This may keep secretions moist and make it easier to breathe.  Drink enough water and fluids to keep your urine clear or pale yellow.  Rest as needed.  Return to work when your temperature has returned to normal or as your caregiver advises. You may need to stay home longer to avoid infecting others. You can also use a face mask and careful hand washing to prevent spread of the virus. SEEK MEDICAL CARE IF:   After the first few days, you feel you are getting worse rather than better.  You need your caregiver's advice about medicines to control symptoms.  You develop chills, worsening shortness of breath, or brown or red sputum. These may be signs of pneumonia.  You develop yellow or brown nasal discharge or pain in the face, especially when you bend forward. These may be signs of sinusitis.  You develop a fever, swollen neck glands, pain with swallowing, or white areas in the back of your throat. These may be signs of strep throat. SEEK IMMEDIATE MEDICAL CARE IF:   You have a fever.  You develop severe or persistent headache, ear pain, sinus pain, or chest pain.  You develop wheezing, a prolonged cough, cough up blood, or have a change in your usual mucus (if you have chronic lung disease).  You develop sore muscles or a stiff neck. Document Released: 04/04/2001 Document Revised: 01/01/2012 Document Reviewed: 01/14/2014 [facility] Patient Information 2015 [place], [place]. This information is not intended to replace advice given to you by  your health care provider. Make sure you discuss any questions you have with your health care provider.  Cough, Adult  A cough is a reflex that helps clear your throat and airways. It can help heal the body or may be a reaction to an irritated airway. A cough may only last 2 or 3 weeks (acute) or may last more than 8 weeks (chronic).  CAUSES Acute cough:  Viral or bacterial infections. Chronic cough:  Infections.  Allergies.  Asthma.  Post-nasal drip.  Smoking.  Heartburn or acid reflux.  Some medicines.  Chronic lung problems (COPD).  Cancer. SYMPTOMS   Cough.  Fever.  Chest pain.  Increased breathing rate.  High-pitched whistling sound when breathing (wheezing).  Colored mucus that you cough up (sputum). TREATMENT   A bacterial cough may be treated with antibiotic medicine.  A viral cough must run its course and will not respond to antibiotics.  Your caregiver may recommend other treatments if you have a chronic cough. HOME CARE INSTRUCTIONS   Only take over-the-counter or prescription medicines for pain, discomfort, or fever as directed by your caregiver. Use cough suppressants only as directed by your caregiver.  Use a cold steam vaporizer or humidifier in your bedroom or home to help loosen secretions.  Sleep in a semi-upright  position if your cough is worse at night.  Rest as needed.  Stop smoking if you smoke. SEEK IMMEDIATE MEDICAL CARE IF:   You have pus in your sputum.  Your cough starts to worsen.  You cannot control your cough with suppressants and are losing sleep.  You begin coughing up blood.  You have difficulty breathing.  You develop pain which is getting worse or is uncontrolled with medicine.  You have a fever. MAKE SURE YOU:   Understand these instructions.  Will watch your condition.  Will get help right away if you are not doing well or get worse. Document Released: 04/07/2011 Document Revised: 01/01/2012 Document  Reviewed: 04/07/2011 [facility] Patient Information 2015 [place], [place]. This information is not intended to replace advice given to you by your health care provider. Make sure you discuss any questions you have with your health care provider.

## 2015-04-16 ENCOUNTER — Other Ambulatory Visit: Payer: Self-pay | Admitting: Physician Assistant

## 2015-04-23 DIAGNOSIS — H2513 Age-related nuclear cataract, bilateral: Secondary | ICD-10-CM | POA: Diagnosis not present

## 2015-04-23 DIAGNOSIS — H43811 Vitreous degeneration, right eye: Secondary | ICD-10-CM | POA: Diagnosis not present

## 2015-05-16 ENCOUNTER — Other Ambulatory Visit: Payer: Self-pay | Admitting: Physician Assistant

## 2015-05-21 DIAGNOSIS — H43811 Vitreous degeneration, right eye: Secondary | ICD-10-CM | POA: Diagnosis not present

## 2015-06-17 ENCOUNTER — Other Ambulatory Visit: Payer: Self-pay | Admitting: Physician Assistant

## 2015-06-18 DIAGNOSIS — M5030 Other cervical disc degeneration, unspecified cervical region: Secondary | ICD-10-CM | POA: Diagnosis not present

## 2015-06-18 DIAGNOSIS — M47812 Spondylosis without myelopathy or radiculopathy, cervical region: Secondary | ICD-10-CM | POA: Diagnosis not present

## 2015-06-18 DIAGNOSIS — M542 Cervicalgia: Secondary | ICD-10-CM | POA: Diagnosis not present

## 2015-06-25 ENCOUNTER — Other Ambulatory Visit: Payer: Self-pay | Admitting: Neurosurgery

## 2015-06-25 DIAGNOSIS — M47812 Spondylosis without myelopathy or radiculopathy, cervical region: Secondary | ICD-10-CM

## 2015-07-01 ENCOUNTER — Ambulatory Visit
Admission: RE | Admit: 2015-07-01 | Discharge: 2015-07-01 | Disposition: A | Discharge status: Home / Self Care | Payer: Medicare Other | Source: Ambulatory Visit | Attending: Neurosurgery | Admitting: Neurosurgery

## 2015-07-01 DIAGNOSIS — M47812 Spondylosis without myelopathy or radiculopathy, cervical region: Secondary | ICD-10-CM

## 2015-07-01 MED ORDER — IOHEXOL 300 MG/ML  SOLN
1.0000 mL | Freq: Once | INTRAMUSCULAR | Status: DC | PRN
  Administered 2015-07-01: 1 mL via INTRA_ARTICULAR

## 2015-07-01 MED ORDER — DEXAMETHASONE SODIUM PHOSPHATE 4 MG/ML IJ SOLN
4.0000 mg | Freq: Once | INTRAMUSCULAR | Status: AC
  Administered 2015-07-01: 4 mg via INTRA_ARTICULAR

## 2015-07-01 NOTE — Discharge Instructions (Signed)

## 2015-08-02 DIAGNOSIS — M47812 Spondylosis without myelopathy or radiculopathy, cervical region: Secondary | ICD-10-CM | POA: Diagnosis not present

## 2015-08-02 DIAGNOSIS — M5030 Other cervical disc degeneration, unspecified cervical region: Secondary | ICD-10-CM | POA: Diagnosis not present

## 2015-08-02 DIAGNOSIS — M542 Cervicalgia: Secondary | ICD-10-CM | POA: Diagnosis not present

## 2015-08-12 ENCOUNTER — Other Ambulatory Visit: Payer: Self-pay | Admitting: Physician Assistant

## 2015-08-17 DIAGNOSIS — M545 Low back pain: Secondary | ICD-10-CM | POA: Diagnosis not present

## 2015-08-17 DIAGNOSIS — Z23 Encounter for immunization: Secondary | ICD-10-CM | POA: Diagnosis not present

## 2015-08-17 DIAGNOSIS — Z0000 Encounter for general adult medical examination without abnormal findings: Secondary | ICD-10-CM | POA: Diagnosis not present

## 2015-08-17 DIAGNOSIS — K219 Gastro-esophageal reflux disease without esophagitis: Secondary | ICD-10-CM | POA: Diagnosis not present

## 2015-08-17 DIAGNOSIS — Z1389 Encounter for screening for other disorder: Secondary | ICD-10-CM | POA: Diagnosis not present

## 2015-08-17 DIAGNOSIS — I10 Essential (primary) hypertension: Secondary | ICD-10-CM | POA: Diagnosis not present

## 2015-08-22 ENCOUNTER — Emergency Department (HOSPITAL_COMMUNITY)
Admission: EM | Admit: 2015-08-22 | Discharge: 2015-08-22 | Disposition: A | Discharge status: Home / Self Care | Payer: Medicare Other | Attending: Emergency Medicine | Admitting: Emergency Medicine

## 2015-08-22 ENCOUNTER — Encounter (HOSPITAL_COMMUNITY): Payer: Self-pay | Admitting: *Deleted

## 2015-08-22 DIAGNOSIS — K219 Gastro-esophageal reflux disease without esophagitis: Secondary | ICD-10-CM | POA: Diagnosis not present

## 2015-08-22 DIAGNOSIS — Z7951 Long term (current) use of inhaled steroids: Secondary | ICD-10-CM | POA: Diagnosis not present

## 2015-08-22 DIAGNOSIS — Z79899 Other long term (current) drug therapy: Secondary | ICD-10-CM | POA: Diagnosis not present

## 2015-08-22 DIAGNOSIS — Z7982 Long term (current) use of aspirin: Secondary | ICD-10-CM | POA: Insufficient documentation

## 2015-08-22 DIAGNOSIS — R202 Paresthesia of skin: Secondary | ICD-10-CM | POA: Diagnosis not present

## 2015-08-22 DIAGNOSIS — R209 Unspecified disturbances of skin sensation: Secondary | ICD-10-CM | POA: Diagnosis not present

## 2015-08-22 DIAGNOSIS — I10 Essential (primary) hypertension: Secondary | ICD-10-CM | POA: Insufficient documentation

## 2015-08-22 LAB — I-STAT CHEM 8, ED
BUN: 23 mg/dL — AB (ref 6–20)
CHLORIDE: 101 mmol/L (ref 101–111)
Calcium, Ion: 1.16 mmol/L (ref 1.13–1.30)
Creatinine, Ser: 1.2 mg/dL (ref 0.61–1.24)
Glucose, Bld: 74 mg/dL (ref 65–99)
HEMATOCRIT: 44 % (ref 39.0–52.0)
HEMOGLOBIN: 15 g/dL (ref 13.0–17.0)
Potassium: 4.5 mmol/L (ref 3.5–5.1)
Sodium: 140 mmol/L (ref 135–145)
TCO2: 27 mmol/L (ref 0–100)

## 2015-08-22 NOTE — Discharge Instructions (Signed)
Paresthesia Paresthesia is an abnormal burning or prickling sensation. This sensation is generally felt in the hands, arms, legs, or feet. However, it may occur in any part of the body. Usually, it is not painful. The feeling may be described as:  Tingling or numbness.  Pins and needles.  Skin crawling.  Buzzing.  Limbs falling asleep.  Itching. Most people experience temporary (transient) paresthesia at some time in their lives. Paresthesia may occur when you breathe too quickly (hyperventilation). It can also occur without any apparent cause. Commonly, paresthesia occurs when pressure is placed on a nerve. The sensation quickly goes away after the pressure is removed. For some people, however, paresthesia is a long-lasting (chronic) condition that is caused by an underlying disorder. If you continue to have paresthesia, you may need further medical evaluation. HOME CARE INSTRUCTIONS Watch your condition for any changes. Taking the following actions may help to lessen any discomfort that you are feeling:  Avoid drinking alcohol.  Try acupuncture or massage to help relieve your symptoms.  Keep all follow-up visits as directed by your health care provider. This is important. SEEK MEDICAL CARE IF:  You continue to have episodes of paresthesia.  Your burning or prickling feeling gets worse when you walk.  You have pain, cramps, or dizziness.  You develop a rash. SEEK IMMEDIATE MEDICAL CARE IF:  You feel weak.  You have trouble walking or moving.  You have problems with speech, understanding, or vision.  You feel confused.  You cannot control your bladder or bowel movements.  You have numbness after an injury.  You faint.   This information is not intended to replace advice given to you by your health care provider. Make sure you discuss any questions you have with your health care provider.   Document Released: 09/29/2002 Document Revised: 02/23/2015 Document Reviewed:  10/05/2014 Elsevier Interactive Patient Education 2016 [employer].  

## 2015-08-22 NOTE — ED Notes (Signed)
Pt placed on monitor upon arrival to room. Pt monitored by blood pressure and pulse ox. Pts family remains at bedside.  

## 2015-08-22 NOTE — ED Notes (Signed)
Pt states he was standing getting ready for church and he began having burning sensation and weakness radiating from feet to knees, lasting 5 min.  Also has been experiencing tingling to L neck for several days.  Neuro intact in triage.  Recently rcvd shingles shot.

## 2015-08-22 NOTE — ED Provider Notes (Signed)
CSN: [id number]     Arrival date & time 08/22/15  6010 History   First MD Initiated Contact with Patient 08/22/15 1054     Chief Complaint  Patient presents with  . paresthesia    HPI   81 year old male presents today with paresthesia to his lower extremities.   Pt reports symptoms began acutely this morning with a "buring" sensation to the lower extremities starting in the feet and making its way into the mid thigh. He reports symptoms resolved after 5 minutes but reappeared several minutes later, again resolving on their own. Pt reports associated weakness of the legs.   Past Medical History  Diagnosis Date  . Hypertension   . Back problem   . GERD (gastroesophageal reflux disease)    Past Surgical History  Procedure Laterality Date  . Hernia repair    . Spine surgery    . Cholecystectomy    . Cervical fusion     No family history on file. Social History  Substance Use Topics  . Smoking status: Never Smoker   . Smokeless tobacco: None  . Alcohol Use: No    Review of Systems  All other systems reviewed and are negative.     Allergies  Shellfish allergy  Home Medications   Prior to Admission medications   Medication Sig Start Date End Date Taking? Authorizing Provider  acetaminophen (TYLENOL) 500 MG tablet Take 500 mg by mouth every 6 (six) hours as needed for mild pain.   Yes Historical Provider, MD  amLODipine (NORVASC) 5 MG tablet Take 5 mg by mouth daily.   Yes Historical Provider, MD  aspirin 81 MG tablet Take 81 mg by mouth daily.   Yes Historical Provider, MD  docusate sodium (COLACE) 100 MG capsule Take 200 mg by mouth daily.   Yes Historical Provider, MD  fluticasone (FLONASE) 50 MCG/ACT nasal spray Place 2 sprays into both nostrils daily. 06/18/15  Yes Dorian Heckle English, PA  meloxicam (MOBIC) 15 MG tablet Take 15 mg by mouth daily.   Yes Historical Provider, MD  Multiple Vitamins-Minerals (MULTIVITAMIN WITH MINERALS) tablet Take 1 tablet by mouth daily.    Yes Historical Provider, MD  olmesartan-hydrochlorothiazide (BENICAR HCT) 40-12.5 MG per tablet Take 1 tablet by mouth daily.   Yes Historical Provider, MD  pantoprazole (PROTONIX) 40 MG tablet Take 40 mg by mouth daily.   Yes Historical Provider, MD  ranitidine (ZANTAC) 300 MG tablet Take 300 mg by mouth at bedtime.   Yes Historical Provider, MD  traMADol (ULTRAM) 50 MG tablet Take 50-100 mg by mouth every 6 (six) hours as needed for moderate pain.    Yes Historical Provider, MD   BP 159/89 mmHg  Pulse 68  Temp(Src) 97.9 F (36.6 C) (Oral)  Resp 18  Ht 5\' 8"  (1.727 m)  Wt 181 lb (82.101 kg)  BMI 27.53 kg/m2  SpO2 98%   Physical Exam  Constitutional: He is oriented to person, place, and time. He appears well-developed and well-nourished.  HENT:  Head: Normocephalic and atraumatic.  Eyes: Conjunctivae are normal. Pupils are equal, round, and reactive to light. Right eye exhibits no discharge. Left eye exhibits no discharge. No scleral icterus.  Neck: Normal range of motion. No JVD present. No tracheal deviation present.  Cardiovascular: Normal rate, regular rhythm, normal heart sounds and intact distal pulses.  Exam reveals no gallop and no friction rub.   No murmur heard. Pulmonary/Chest: Effort normal and breath sounds normal. No stridor. No respiratory distress. He has  no wheezes. He has no rales. He exhibits no tenderness.  Abdominal: Soft. There is no tenderness.  Musculoskeletal: Normal range of motion. He exhibits no edema or tenderness.  No rash no signs of trauma, no warmth to palpation in the lower extremities, full active range of motion of the ankles toes, sensation intact, pedal pulses 2+, Refill less than 3 seconds  Neurological: He is alert and oriented to person, place, and time. Coordination normal.  Skin: Skin is warm and dry. No rash noted. No erythema. No pallor.  Psychiatric: He has a normal mood and affect. His behavior is normal. Judgment and thought content  normal.  Nursing note and vitals reviewed.   ED Course  Procedures (including critical care time) Labs Review Labs Reviewed  I-STAT CHEM 8, ED - Abnormal; Notable for the following:    BUN 23 (*)    All other components within normal limits    Imaging Review No results found. I have personally reviewed and evaluated these images and lab results as part of my medical decision-making.   EKG Interpretation None      MDM   Final diagnoses:  Paresthesia    Labs: I stat chem 8- no significant findings  Imaging:  Consults:  Therapeutics:  Discharge Meds:   Assessment/Plan:  Patient presents with paresthesias to his legs bilateral. Time of evaluation patient has no symptoms. Patient has been feeling well otherwise, no significant findings on today's exam. Patient in no acute distress, follow-up visit with primary care provider this week previously scheduled. Patient is instructed to make this follow-up, return to the emergency room immediately if any new or worsening signs or symptoms present.         Okey Regal, PA-C 08/22/15 1555  Sherwood Gambler, MD 08/23/15 [phone]

## 2015-11-23 DIAGNOSIS — M542 Cervicalgia: Secondary | ICD-10-CM | POA: Diagnosis not present

## 2015-11-23 DIAGNOSIS — M4722 Other spondylosis with radiculopathy, cervical region: Secondary | ICD-10-CM | POA: Diagnosis not present

## 2015-11-23 DIAGNOSIS — Z6828 Body mass index (BMI) 28.0-28.9, adult: Secondary | ICD-10-CM | POA: Diagnosis not present

## 2015-11-23 DIAGNOSIS — M5030 Other cervical disc degeneration, unspecified cervical region: Secondary | ICD-10-CM | POA: Diagnosis not present

## 2015-11-23 DIAGNOSIS — M5412 Radiculopathy, cervical region: Secondary | ICD-10-CM | POA: Diagnosis not present

## 2015-11-23 DIAGNOSIS — I10 Essential (primary) hypertension: Secondary | ICD-10-CM | POA: Diagnosis not present

## 2015-11-25 DIAGNOSIS — M4802 Spinal stenosis, cervical region: Secondary | ICD-10-CM | POA: Diagnosis not present

## 2015-11-25 DIAGNOSIS — M4722 Other spondylosis with radiculopathy, cervical region: Secondary | ICD-10-CM | POA: Diagnosis not present

## 2015-12-08 DIAGNOSIS — M542 Cervicalgia: Secondary | ICD-10-CM | POA: Diagnosis not present

## 2015-12-08 DIAGNOSIS — M5412 Radiculopathy, cervical region: Secondary | ICD-10-CM | POA: Diagnosis not present

## 2015-12-08 DIAGNOSIS — M5030 Other cervical disc degeneration, unspecified cervical region: Secondary | ICD-10-CM | POA: Diagnosis not present

## 2015-12-08 DIAGNOSIS — M5020 Other cervical disc displacement, unspecified cervical region: Secondary | ICD-10-CM | POA: Diagnosis not present

## 2015-12-08 DIAGNOSIS — M4722 Other spondylosis with radiculopathy, cervical region: Secondary | ICD-10-CM | POA: Diagnosis not present

## 2015-12-08 DIAGNOSIS — I10 Essential (primary) hypertension: Secondary | ICD-10-CM | POA: Diagnosis not present

## 2015-12-08 DIAGNOSIS — Z6829 Body mass index (BMI) 29.0-29.9, adult: Secondary | ICD-10-CM | POA: Diagnosis not present

## 2015-12-09 ENCOUNTER — Other Ambulatory Visit: Payer: Self-pay | Admitting: Neurosurgery

## 2015-12-17 ENCOUNTER — Encounter (HOSPITAL_COMMUNITY): Payer: Self-pay

## 2015-12-17 ENCOUNTER — Encounter (HOSPITAL_COMMUNITY)
Admission: RE | Admit: 2015-12-17 | Discharge: 2015-12-17 | Disposition: A | Discharge status: Home / Self Care | Payer: Medicare Other | Source: Ambulatory Visit | Attending: Neurosurgery | Admitting: Neurosurgery

## 2015-12-17 DIAGNOSIS — K219 Gastro-esophageal reflux disease without esophagitis: Secondary | ICD-10-CM | POA: Insufficient documentation

## 2015-12-17 DIAGNOSIS — Z01818 Encounter for other preprocedural examination: Secondary | ICD-10-CM | POA: Diagnosis not present

## 2015-12-17 DIAGNOSIS — R9431 Abnormal electrocardiogram [ECG] [EKG]: Secondary | ICD-10-CM | POA: Insufficient documentation

## 2015-12-17 DIAGNOSIS — M5020 Other cervical disc displacement, unspecified cervical region: Secondary | ICD-10-CM | POA: Diagnosis not present

## 2015-12-17 DIAGNOSIS — Z01812 Encounter for preprocedural laboratory examination: Secondary | ICD-10-CM | POA: Diagnosis not present

## 2015-12-17 DIAGNOSIS — I10 Essential (primary) hypertension: Secondary | ICD-10-CM | POA: Insufficient documentation

## 2015-12-17 DIAGNOSIS — Z7982 Long term (current) use of aspirin: Secondary | ICD-10-CM | POA: Diagnosis not present

## 2015-12-17 DIAGNOSIS — Z79899 Other long term (current) drug therapy: Secondary | ICD-10-CM | POA: Diagnosis not present

## 2015-12-17 HISTORY — DX: Unspecified osteoarthritis, unspecified site: M19.90

## 2015-12-17 HISTORY — DX: Personal history of other medical treatment: Z92.89

## 2015-12-17 LAB — CBC
HCT: 43.3 % (ref 39.0–52.0)
Hemoglobin: 14.3 g/dL (ref 13.0–17.0)
MCH: 30.4 pg (ref 26.0–34.0)
MCHC: 33 g/dL (ref 30.0–36.0)
MCV: 92.1 fL (ref 78.0–100.0)
PLATELETS: 234 10*3/uL (ref 150–400)
RBC: 4.7 MIL/uL (ref 4.22–5.81)
RDW: 13.9 % (ref 11.5–15.5)
WBC: 6.7 10*3/uL (ref 4.0–10.5)

## 2015-12-17 LAB — SURGICAL PCR SCREEN
MRSA, PCR: NEGATIVE
Staphylococcus aureus: POSITIVE — AB

## 2015-12-17 LAB — BASIC METABOLIC PANEL
Anion gap: 11 (ref 5–15)
BUN: 11 mg/dL (ref 6–20)
CALCIUM: 9.4 mg/dL (ref 8.9–10.3)
CO2: 28 mmol/L (ref 22–32)
CREATININE: 1.06 mg/dL (ref 0.61–1.24)
Chloride: 100 mmol/L — ABNORMAL LOW (ref 101–111)
Glucose, Bld: 95 mg/dL (ref 65–99)
Potassium: 3.7 mmol/L (ref 3.5–5.1)
SODIUM: 139 mmol/L (ref 135–145)

## 2015-12-17 NOTE — Pre-Procedure Instructions (Signed)
Rodrick Priestly Mcnall  12/17/2015      [facility] [id number] [place] [address] [phone] [phone]    Your procedure is scheduled on 12/23/2015.  Report to [facility] Admitting at 10:15 A.M.  Call this number if you have problems the morning of surgery:  [phone]   Remember:  Do not eat food or drink liquids after midnight.  Wednesday   Take these medicines the morning of surgery with A SIP OF WATER: AMLODIPINE, TRAMADOL, PROTONIX   Do not wear jewelry   Do not wear lotions, powders, or perfumes.  You may wear deodorant.    Men may shave face and neck.   Do not bring valuables to the hospital.   [facility] is not responsible for any belongings or valuables.  Contacts, dentures or bridgework may not be worn into surgery.  Leave your suitcase in the car.  After surgery it may be brought to your room.  For patients admitted to the hospital, discharge time will be determined by your treatment team.  Patients discharged the day of surgery will not be allowed to drive home.   Name and phone number of your driver:   With family  Special instructions:  Special Instructions: [place] - Preparing for Surgery  Before surgery, you can play an important role.  Because skin is not sterile, your skin needs to be as free of germs as possible.  You can reduce the number of germs on you skin by washing with CHG (chlorahexidine gluconate) soap before surgery.  CHG is an antiseptic cleaner which kills germs and bonds with the skin to continue killing germs even after washing.  Please DO NOT use if you have an allergy to CHG or antibacterial soaps.  If your skin becomes reddened/irritated stop using the CHG and inform your nurse when you arrive at Short Stay.  Do not shave (including legs and underarms) for at least 48 hours prior to the first CHG shower.  You may shave your face.  Please follow these  instructions carefully:   1.  Shower with CHG Soap the night before surgery and the  morning of Surgery.  2.  If you choose to wash your hair, wash your hair first as usual with your  normal shampoo.  3.  After you shampoo, rinse your hair and body thoroughly to remove the  Shampoo.  4.  Use CHG as you would any other liquid soap.  You can apply chg directly to the skin and wash gently with scrungie or a clean washcloth.  5.  Apply the CHG Soap to your body ONLY FROM THE NECK DOWN.    Do not use on open wounds or open sores.  Avoid contact with your eyes, ears, mouth and genitals (private parts).  Wash genitals (private parts)   with your normal soap.  6.  Wash thoroughly, paying special attention to the area where your surgery will be performed.  7.  Thoroughly rinse your body with warm water from the neck down.  8.  DO NOT shower/wash with your normal soap after using and rinsing off   the CHG Soap.  9.  Pat yourself dry with a clean towel.            10.  Wear clean pajamas.            11.  Place clean sheets on your bed the night of your  first shower and do not sleep with pets.  Day of Surgery  Do not apply any lotions/deodorants the morning of surgery.  Please wear clean clothes to the hospital/surgery center.  Please read over the following fact sheets that you were given. Pain Booklet, Coughing and Deep Breathing, MRSA Information and Surgical Site Infection Prevention

## 2015-12-17 NOTE — Progress Notes (Signed)
Pt. Reports that he had a stress test approx. 3-5 yrs. Pt. States it was probably done with [facility].- told that it was wnl. Pt. Denies all chest concerns. EKG to be done today.  Pt. Followed by PCP- [facility]- [place]. Pt. Unsure where or when he would have had a last EKG.

## 2015-12-20 NOTE — Progress Notes (Signed)
Anesthesia Chart Review:  Pt is an 82 year old male scheduled for C4-5 ACDF on 12/23/2015 with Dr. Sherwood Gambler.   PMH includes:  HTN, GERD. Never smoker. BMI 27  Medications include: amlodipine, ASA, olmesartan-hctz, protonix, zantac.   Preoperative labs reviewed.    EKG 12/17/15: Sinus bradycardia (58 bpm). Septal infarct, age undetermined.   If no changes, I anticipate pt can proceed with surgery as scheduled.   Willeen Cass, FNP-BC [facility]/Anesthesiology Phone: [phone] 12/20/2015 4:09 PM

## 2015-12-22 MED ORDER — CEFAZOLIN SODIUM-DEXTROSE 2-3 GM-% IV SOLR
2.0000 g | INTRAVENOUS | Status: AC
  Administered 2015-12-23: 2 g via INTRAVENOUS
  Filled 2015-12-22: qty 50

## 2015-12-23 ENCOUNTER — Encounter (HOSPITAL_COMMUNITY)
Admission: RE | Disposition: A | Discharge status: Home / Self Care | Payer: Self-pay | Source: Ambulatory Visit | Attending: Neurosurgery

## 2015-12-23 ENCOUNTER — Ambulatory Visit (HOSPITAL_COMMUNITY): Payer: Medicare Other | Admitting: Emergency Medicine

## 2015-12-23 ENCOUNTER — Encounter (HOSPITAL_COMMUNITY): Payer: Self-pay | Admitting: Certified Registered Nurse Anesthetist

## 2015-12-23 ENCOUNTER — Ambulatory Visit (HOSPITAL_COMMUNITY): Payer: Medicare Other

## 2015-12-23 ENCOUNTER — Ambulatory Visit (HOSPITAL_COMMUNITY): Payer: Medicare Other | Admitting: Certified Registered Nurse Anesthetist

## 2015-12-23 ENCOUNTER — Observation Stay (HOSPITAL_COMMUNITY)
Admission: RE | Admit: 2015-12-23 | Discharge: 2015-12-24 | Disposition: A | Discharge status: Home / Self Care | Payer: Medicare Other | Source: Ambulatory Visit | Attending: Neurosurgery | Admitting: Neurosurgery

## 2015-12-23 DIAGNOSIS — M5020 Other cervical disc displacement, unspecified cervical region: Secondary | ICD-10-CM | POA: Diagnosis not present

## 2015-12-23 DIAGNOSIS — Z419 Encounter for procedure for purposes other than remedying health state, unspecified: Secondary | ICD-10-CM

## 2015-12-23 DIAGNOSIS — I10 Essential (primary) hypertension: Secondary | ICD-10-CM | POA: Insufficient documentation

## 2015-12-23 DIAGNOSIS — Z981 Arthrodesis status: Secondary | ICD-10-CM | POA: Diagnosis not present

## 2015-12-23 DIAGNOSIS — K219 Gastro-esophageal reflux disease without esophagitis: Secondary | ICD-10-CM | POA: Diagnosis not present

## 2015-12-23 DIAGNOSIS — M50121 Cervical disc disorder at C4-C5 level with radiculopathy: Principal | ICD-10-CM | POA: Insufficient documentation

## 2015-12-23 DIAGNOSIS — M5030 Other cervical disc degeneration, unspecified cervical region: Secondary | ICD-10-CM | POA: Diagnosis not present

## 2015-12-23 DIAGNOSIS — M50221 Other cervical disc displacement at C4-C5 level: Secondary | ICD-10-CM | POA: Diagnosis not present

## 2015-12-23 DIAGNOSIS — M4722 Other spondylosis with radiculopathy, cervical region: Secondary | ICD-10-CM | POA: Diagnosis not present

## 2015-12-23 DIAGNOSIS — M1990 Unspecified osteoarthritis, unspecified site: Secondary | ICD-10-CM | POA: Diagnosis not present

## 2015-12-23 HISTORY — PX: ANTERIOR CERVICAL DECOMP/DISCECTOMY FUSION: SHX1161

## 2015-12-23 SURGERY — ANTERIOR CERVICAL DECOMPRESSION/DISCECTOMY FUSION 1 LEVEL
Anesthesia: General | Site: Spine Cervical

## 2015-12-23 MED ORDER — AMLODIPINE BESYLATE 5 MG PO TABS
5.0000 mg | ORAL_TABLET | Freq: Every day | ORAL | Status: DC
  Filled 2015-12-23: qty 1

## 2015-12-23 MED ORDER — SODIUM CHLORIDE 0.9% FLUSH
3.0000 mL | Freq: Two times a day (BID) | INTRAVENOUS | Status: DC
  Administered 2015-12-23: 3 mL via INTRAVENOUS

## 2015-12-23 MED ORDER — PHENYLEPHRINE HCL 10 MG/ML IJ SOLN
10.0000 mg | INTRAVENOUS | Status: DC | PRN
  Administered 2015-12-23: 20 ug/min via INTRAVENOUS

## 2015-12-23 MED ORDER — DOCUSATE SODIUM 100 MG PO CAPS
200.0000 mg | ORAL_CAPSULE | Freq: Every day | ORAL | Status: DC
  Administered 2015-12-23: 200 mg via ORAL
  Filled 2015-12-23: qty 2

## 2015-12-23 MED ORDER — LIDOCAINE HCL (CARDIAC) 20 MG/ML IV SOLN
INTRAVENOUS | Status: DC | PRN
  Administered 2015-12-23: 60 mg via INTRAVENOUS

## 2015-12-23 MED ORDER — ACETAMINOPHEN 325 MG PO TABS: 650.0000 mg | ORAL_TABLET | ORAL | Status: DC | PRN

## 2015-12-23 MED ORDER — FENTANYL CITRATE (PF) 250 MCG/5ML IJ SOLN
INTRAMUSCULAR | Status: AC
  Filled 2015-12-23: qty 5

## 2015-12-23 MED ORDER — KETOROLAC TROMETHAMINE 30 MG/ML IJ SOLN
15.0000 mg | Freq: Once | INTRAMUSCULAR | Status: AC
  Administered 2015-12-23: 15 mg via INTRAVENOUS

## 2015-12-23 MED ORDER — FAMOTIDINE 40 MG PO TABS
40.0000 mg | ORAL_TABLET | Freq: Every day | ORAL | Status: DC
  Administered 2015-12-23: 40 mg via ORAL
  Filled 2015-12-23 (×2): qty 1
  Filled 2015-12-23: qty 2

## 2015-12-23 MED ORDER — BUPIVACAINE HCL (PF) 0.5 % IJ SOLN
INTRAMUSCULAR | Status: DC | PRN
  Administered 2015-12-23: 5 mL

## 2015-12-23 MED ORDER — ALUM & MAG HYDROXIDE-SIMETH 200-200-20 MG/5ML PO SUSP: 30.0000 mL | Freq: Four times a day (QID) | ORAL | Status: DC | PRN

## 2015-12-23 MED ORDER — HYDROXYZINE HCL 50 MG/ML IM SOLN: 50.0000 mg | INTRAMUSCULAR | Status: DC | PRN

## 2015-12-23 MED ORDER — SODIUM CHLORIDE 0.9 % IR SOLN
Status: DC | PRN
  Administered 2015-12-23: 500 mL

## 2015-12-23 MED ORDER — LIDOCAINE HCL (CARDIAC) 20 MG/ML IV SOLN
INTRAVENOUS | Status: AC
  Filled 2015-12-23: qty 5

## 2015-12-23 MED ORDER — CYCLOBENZAPRINE HCL 10 MG PO TABS
10.0000 mg | ORAL_TABLET | Freq: Three times a day (TID) | ORAL | Status: DC | PRN
  Administered 2015-12-23: 10 mg via ORAL
  Filled 2015-12-23: qty 1

## 2015-12-23 MED ORDER — ROCURONIUM BROMIDE 100 MG/10ML IV SOLN
INTRAVENOUS | Status: DC | PRN
  Administered 2015-12-23: 40 mg via INTRAVENOUS
  Administered 2015-12-23: 10 mg via INTRAVENOUS

## 2015-12-23 MED ORDER — KETOROLAC TROMETHAMINE 15 MG/ML IJ SOLN
INTRAMUSCULAR | Status: AC
  Filled 2015-12-23: qty 1

## 2015-12-23 MED ORDER — MORPHINE SULFATE (PF) 4 MG/ML IV SOLN: 4.0000 mg | INTRAVENOUS | Status: DC | PRN

## 2015-12-23 MED ORDER — IRBESARTAN 300 MG PO TABS
300.0000 mg | ORAL_TABLET | Freq: Every day | ORAL | Status: DC
  Administered 2015-12-23: 300 mg via ORAL
  Filled 2015-12-23 (×3): qty 1

## 2015-12-23 MED ORDER — FENTANYL CITRATE (PF) 100 MCG/2ML IJ SOLN
INTRAMUSCULAR | Status: AC
  Filled 2015-12-23: qty 2

## 2015-12-23 MED ORDER — HYDROCHLOROTHIAZIDE 12.5 MG PO CAPS
12.5000 mg | ORAL_CAPSULE | Freq: Every day | ORAL | Status: DC
  Administered 2015-12-23: 12.5 mg via ORAL
  Filled 2015-12-23: qty 1

## 2015-12-23 MED ORDER — THROMBIN 5000 UNITS EX SOLR
CUTANEOUS | Status: DC | PRN
  Administered 2015-12-23 (×2): 5000 [IU] via TOPICAL

## 2015-12-23 MED ORDER — PHENOL 1.4 % MT LIQD: 1.0000 | OROMUCOSAL | Status: DC | PRN

## 2015-12-23 MED ORDER — ONDANSETRON HCL 4 MG/2ML IJ SOLN
INTRAMUSCULAR | Status: AC
  Filled 2015-12-23: qty 2

## 2015-12-23 MED ORDER — KCL IN DEXTROSE-NACL 20-5-0.45 MEQ/L-%-% IV SOLN: INTRAVENOUS | Status: DC

## 2015-12-23 MED ORDER — MENTHOL 3 MG MT LOZG: 1.0000 | LOZENGE | OROMUCOSAL | Status: DC | PRN

## 2015-12-23 MED ORDER — OLMESARTAN MEDOXOMIL-HCTZ 40-12.5 MG PO TABS: 1.0000 | ORAL_TABLET | Freq: Every day | ORAL | Status: DC

## 2015-12-23 MED ORDER — BISACODYL 10 MG RE SUPP: 10.0000 mg | Freq: Every day | RECTAL | Status: DC | PRN

## 2015-12-23 MED ORDER — ONDANSETRON HCL 4 MG PO TABS: 4.0000 mg | ORAL_TABLET | Freq: Four times a day (QID) | ORAL | Status: DC | PRN

## 2015-12-23 MED ORDER — PANTOPRAZOLE SODIUM 40 MG PO TBEC
40.0000 mg | DELAYED_RELEASE_TABLET | Freq: Two times a day (BID) | ORAL | Status: DC
  Filled 2015-12-23: qty 1

## 2015-12-23 MED ORDER — ONDANSETRON HCL 4 MG/2ML IJ SOLN
INTRAMUSCULAR | Status: DC | PRN
  Administered 2015-12-23: 4 mg via INTRAVENOUS

## 2015-12-23 MED ORDER — OXYCODONE-ACETAMINOPHEN 5-325 MG PO TABS: 1.0000 | ORAL_TABLET | ORAL | Status: DC | PRN

## 2015-12-23 MED ORDER — THROMBIN 5000 UNITS EX SOLR
OROMUCOSAL | Status: DC | PRN
  Administered 2015-12-23: 5 mL via TOPICAL

## 2015-12-23 MED ORDER — ZOLPIDEM TARTRATE 5 MG PO TABS: 5.0000 mg | ORAL_TABLET | Freq: Every evening | ORAL | Status: DC | PRN

## 2015-12-23 MED ORDER — HEMOSTATIC AGENTS (NO CHARGE) OPTIME
TOPICAL | Status: DC | PRN
  Administered 2015-12-23: 1 via TOPICAL

## 2015-12-23 MED ORDER — GLYCOPYRROLATE 0.2 MG/ML IJ SOLN
INTRAMUSCULAR | Status: DC | PRN
  Administered 2015-12-23: 0.2 mg via INTRAVENOUS

## 2015-12-23 MED ORDER — ACETAMINOPHEN 650 MG RE SUPP: 650.0000 mg | RECTAL | Status: DC | PRN

## 2015-12-23 MED ORDER — FENTANYL CITRATE (PF) 100 MCG/2ML IJ SOLN
25.0000 ug | INTRAMUSCULAR | Status: DC | PRN
  Administered 2015-12-23 (×3): 25 ug via INTRAVENOUS

## 2015-12-23 MED ORDER — KETOROLAC TROMETHAMINE 30 MG/ML IJ SOLN
15.0000 mg | Freq: Four times a day (QID) | INTRAMUSCULAR | Status: DC
  Administered 2015-12-23 – 2015-12-24 (×3): 15 mg via INTRAVENOUS
  Filled 2015-12-23 (×3): qty 1

## 2015-12-23 MED ORDER — ACETAMINOPHEN 10 MG/ML IV SOLN
INTRAVENOUS | Status: AC
  Filled 2015-12-23: qty 100

## 2015-12-23 MED ORDER — SODIUM CHLORIDE 0.9% FLUSH: 3.0000 mL | INTRAVENOUS | Status: DC | PRN

## 2015-12-23 MED ORDER — HYDROXYZINE HCL 25 MG PO TABS: 50.0000 mg | ORAL_TABLET | ORAL | Status: DC | PRN

## 2015-12-23 MED ORDER — PROPOFOL 10 MG/ML IV BOLUS
INTRAVENOUS | Status: AC
  Filled 2015-12-23: qty 20

## 2015-12-23 MED ORDER — ACETAMINOPHEN 10 MG/ML IV SOLN
INTRAVENOUS | Status: DC | PRN
  Administered 2015-12-23: 1000 mg via INTRAVENOUS

## 2015-12-23 MED ORDER — LIDOCAINE-EPINEPHRINE 1 %-1:100000 IJ SOLN
INTRAMUSCULAR | Status: DC | PRN
  Administered 2015-12-23: 5 mL

## 2015-12-23 MED ORDER — MAGNESIUM HYDROXIDE 400 MG/5ML PO SUSP: 30.0000 mL | Freq: Every day | ORAL | Status: DC | PRN

## 2015-12-23 MED ORDER — PROPOFOL 10 MG/ML IV BOLUS
INTRAVENOUS | Status: DC | PRN
  Administered 2015-12-23: 130 mg via INTRAVENOUS

## 2015-12-23 MED ORDER — HYDROCODONE-ACETAMINOPHEN 5-325 MG PO TABS
1.0000 | ORAL_TABLET | ORAL | Status: DC | PRN
  Administered 2015-12-23 (×2): 2 via ORAL
  Filled 2015-12-23 (×2): qty 2

## 2015-12-23 MED ORDER — 0.9 % SODIUM CHLORIDE (POUR BTL) OPTIME
TOPICAL | Status: DC | PRN
  Administered 2015-12-23: 1000 mL

## 2015-12-23 MED ORDER — SUGAMMADEX SODIUM 200 MG/2ML IV SOLN
INTRAVENOUS | Status: AC
  Filled 2015-12-23: qty 2

## 2015-12-23 MED ORDER — FENTANYL CITRATE (PF) 100 MCG/2ML IJ SOLN
INTRAMUSCULAR | Status: DC | PRN
  Administered 2015-12-23 (×3): 50 ug via INTRAVENOUS
  Administered 2015-12-23: 100 ug via INTRAVENOUS

## 2015-12-23 MED ORDER — DEXAMETHASONE SODIUM PHOSPHATE 4 MG/ML IJ SOLN
INTRAMUSCULAR | Status: DC | PRN
  Administered 2015-12-23: 10 mg via INTRAVENOUS

## 2015-12-23 MED ORDER — LACTATED RINGERS IV SOLN
INTRAVENOUS | Status: DC
  Administered 2015-12-23 (×2): via INTRAVENOUS

## 2015-12-23 MED ORDER — PROMETHAZINE HCL 25 MG/ML IJ SOLN: 6.2500 mg | INTRAMUSCULAR | Status: DC | PRN

## 2015-12-23 MED ORDER — SUGAMMADEX SODIUM 200 MG/2ML IV SOLN
INTRAVENOUS | Status: DC | PRN
  Administered 2015-12-23: 200 mg via INTRAVENOUS

## 2015-12-23 MED ORDER — ONDANSETRON HCL 4 MG/2ML IJ SOLN: 4.0000 mg | Freq: Four times a day (QID) | INTRAMUSCULAR | Status: DC | PRN

## 2015-12-23 MED ORDER — ROCURONIUM BROMIDE 50 MG/5ML IV SOLN
INTRAVENOUS | Status: AC
  Filled 2015-12-23: qty 1

## 2015-12-23 SURGICAL SUPPLY — 58 items
ADH SKN CLS APL DERMABOND .7 (GAUZE/BANDAGES/DRESSINGS) ×1
ALLOGRAFT 7X14X11 (Bone Implant) ×1 IMPLANT
BAG DECANTER FOR FLEXI CONT (MISCELLANEOUS) ×2 IMPLANT
BIT DRILL NEURO 2X3.1 SFT TUCH (MISCELLANEOUS) ×1 IMPLANT
BLADE ULTRA TIP 2M (BLADE) ×2 IMPLANT
BRUSH SCRUB EZ PLAIN DRY (MISCELLANEOUS) ×2 IMPLANT
CANISTER SUCT 3000ML PPV (MISCELLANEOUS) ×2 IMPLANT
COVER MAYO STAND STRL (DRAPES) ×2 IMPLANT
DECANTER SPIKE VIAL GLASS SM (MISCELLANEOUS) ×1 IMPLANT
DERMABOND ADVANCED (GAUZE/BANDAGES/DRESSINGS) ×1
DERMABOND ADVANCED .7 DNX12 (GAUZE/BANDAGES/DRESSINGS) ×1 IMPLANT
DRAPE LAPAROTOMY 100X72 PEDS (DRAPES) ×2 IMPLANT
DRAPE MICROSCOPE LEICA (MISCELLANEOUS) ×2 IMPLANT
DRAPE POUCH INSTRU U-SHP 10X18 (DRAPES) ×2 IMPLANT
DRAPE PROXIMA HALF (DRAPES) ×1 IMPLANT
DRILL NEURO 2X3.1 SOFT TOUCH (MISCELLANEOUS) ×2
ELECT COATED BLADE 2.86 ST (ELECTRODE) ×2 IMPLANT
ELECT REM PT RETURN 9FT ADLT (ELECTROSURGICAL) ×2
ELECTRODE REM PT RTRN 9FT ADLT (ELECTROSURGICAL) ×1 IMPLANT
GLOVE BIO SURGEON STRL SZ8 (GLOVE) ×1 IMPLANT
GLOVE BIO SURGEON STRL SZ8.5 (GLOVE) ×1 IMPLANT
GLOVE BIOGEL PI IND STRL 7.5 (GLOVE) IMPLANT
GLOVE BIOGEL PI IND STRL 8 (GLOVE) ×1 IMPLANT
GLOVE BIOGEL PI INDICATOR 7.5 (GLOVE) ×1
GLOVE BIOGEL PI INDICATOR 8 (GLOVE) ×1
GLOVE ECLIPSE 7.5 STRL STRAW (GLOVE) ×2 IMPLANT
GLOVE EXAM NITRILE LRG STRL (GLOVE) IMPLANT
GLOVE EXAM NITRILE MD LF STRL (GLOVE) IMPLANT
GLOVE EXAM NITRILE XL STR (GLOVE) IMPLANT
GLOVE EXAM NITRILE XS STR PU (GLOVE) IMPLANT
GLOVE SURG SS PI 7.0 STRL IVOR (GLOVE) ×2 IMPLANT
GOWN STRL REUS W/ TWL LRG LVL3 (GOWN DISPOSABLE) IMPLANT
GOWN STRL REUS W/ TWL XL LVL3 (GOWN DISPOSABLE) IMPLANT
GOWN STRL REUS W/TWL 2XL LVL3 (GOWN DISPOSABLE) IMPLANT
GOWN STRL REUS W/TWL LRG LVL3 (GOWN DISPOSABLE)
GOWN STRL REUS W/TWL XL LVL3 (GOWN DISPOSABLE) ×6
HALTER HD/CHIN CERV TRACTION D (MISCELLANEOUS) ×2 IMPLANT
HEMOSTAT POWDER KIT SURGIFOAM (HEMOSTASIS) ×2 IMPLANT
KIT BASIN OR (CUSTOM PROCEDURE TRAY) ×2 IMPLANT
KIT ROOM TURNOVER OR (KITS) ×2 IMPLANT
NDL HYPO 25X1 1.5 SAFETY (NEEDLE) ×1 IMPLANT
NDL SPNL 22GX3.5 QUINCKE BK (NEEDLE) ×1 IMPLANT
NEEDLE HYPO 25X1 1.5 SAFETY (NEEDLE) ×2 IMPLANT
NEEDLE SPNL 22GX3.5 QUINCKE BK (NEEDLE) ×2 IMPLANT
NS IRRIG 1000ML POUR BTL (IV SOLUTION) ×2 IMPLANT
PACK LAMINECTOMY NEURO (CUSTOM PROCEDURE TRAY) ×2 IMPLANT
PAD ARMBOARD 7.5X6 YLW CONV (MISCELLANEOUS) ×6 IMPLANT
PLATE AVIATOR ASSY 1LVL SZ 14 (Plate) ×1 IMPLANT
RUBBERBAND STERILE (MISCELLANEOUS) ×4 IMPLANT
SCREW AVIATOR VAR SELFTAP 4X14 (Screw) ×4 IMPLANT
SPONGE INTESTINAL PEANUT (DISPOSABLE) ×2 IMPLANT
SPONGE SURGIFOAM ABS GEL SZ50 (HEMOSTASIS) ×2 IMPLANT
STAPLER SKIN PROX WIDE 3.9 (STAPLE) ×1 IMPLANT
SUT VIC AB 2-0 CP2 18 (SUTURE) ×2 IMPLANT
SUT VIC AB 3-0 SH 8-18 (SUTURE) ×3 IMPLANT
TOWEL OR 17X24 6PK STRL BLUE (TOWEL DISPOSABLE) ×1 IMPLANT
TOWEL OR 17X26 10 PK STRL BLUE (TOWEL DISPOSABLE) ×2 IMPLANT
WATER STERILE IRR 1000ML POUR (IV SOLUTION) ×2 IMPLANT

## 2015-12-23 NOTE — Anesthesia Preprocedure Evaluation (Addendum)
Anesthesia Evaluation  Patient identified by MRN, date of birth, ID band Patient awake    Reviewed: Allergy & Precautions, NPO status , Patient's Chart, lab work & pertinent test results  Airway Mallampati: II  TM Distance: >3 FB Neck ROM: Limited    Dental  (+) Upper Dentures, Dental Advisory Given   Pulmonary neg pulmonary ROS,    breath sounds clear to auscultation       Cardiovascular hypertension, Pt. on medications  Rhythm:Regular Rate:Normal     Neuro/Psych negative neurological ROS     GI/Hepatic Neg liver ROS, GERD  ,  Endo/Other  negative endocrine ROS  Renal/GU negative Renal ROS     Musculoskeletal  (+) Arthritis ,   Abdominal   Peds  Hematology negative hematology ROS (+)   Anesthesia Other Findings   Reproductive/Obstetrics                            Lab Results  Component Value Date   WBC 6.7 12/17/2015   HGB 14.3 12/17/2015   HCT 43.3 12/17/2015   MCV 92.1 12/17/2015   PLT 234 12/17/2015   Lab Results  Component Value Date   CREATININE 1.06 12/17/2015   BUN 11 12/17/2015   NA 139 12/17/2015   K 3.7 12/17/2015   CL 100* 12/17/2015   CO2 28 12/17/2015    Anesthesia Physical Anesthesia Plan  ASA: III  Anesthesia Plan: General   Post-op Pain Management:    Induction: Intravenous  Airway Management Planned: Oral ETT  Additional Equipment:   Intra-op Plan:   Post-operative Plan: Extubation in OR  Informed Consent: I have reviewed the patients History and Physical, chart, labs and discussed the procedure including the risks, benefits and alternatives for the proposed anesthesia with the patient or authorized representative who has indicated his/her understanding and acceptance.   Dental advisory given  Plan Discussed with: CRNA  Anesthesia Plan Comments:         Anesthesia Quick Evaluation

## 2015-12-23 NOTE — Progress Notes (Signed)
Filed Vitals:   12/23/15 1545 12/23/15 1550 12/23/15 1600 12/23/15 1619  BP:  140/83  160/84  Pulse: 71 57 52 66  Temp:   97.9 F (36.6 C) 97.9 F (36.6 C)  TempSrc:      Resp: 13 10 13 16   Weight:      SpO2: 99% 100% 100% 99%    Patient resting in bed, comfortable. Has ambulated in the halls. Wound clean and dry; no swelling, erythema, or drainage. Patient describes very mild dysphagia. Has voided.  Plan: Continue to progress through postoperative recovery.  Hosie Spangle, MD 12/23/2015, 8:42 PM

## 2015-12-23 NOTE — Anesthesia Procedure Notes (Signed)
Procedure Name: Intubation Performed by: [place] Pink Pre-anesthesia Checklist: Emergency Drugs available, Patient identified, Suction available, Patient being monitored and Timeout performed Patient Re-evaluated:Patient Re-evaluated prior to inductionOxygen Delivery Method: Circle system utilized Preoxygenation: Pre-oxygenation with 100% oxygen Intubation Type: IV induction Ventilation: Mask ventilation without difficulty Laryngoscope Size: Mac and 4 Grade View: Grade I Tube type: Oral Tube size: 7.5 mm Number of attempts: 1 Airway Equipment and Method: Stylet and LTA kit utilized Placement Confirmation: ETT inserted through vocal cords under direct vision,  positive ETCO2 and breath sounds checked- equal and bilateral Secured at: 22 cm Tube secured with: Tape Dental Injury: Teeth and Oropharynx as per pre-operative assessment

## 2015-12-23 NOTE — Anesthesia Postprocedure Evaluation (Signed)
Anesthesia Post Note  Patient: Kevin Mcintyre  Procedure(s) Performed: Procedure(s) (LRB): ANTERIOR CERVICAL DECOMPRESSION/DISCECTOMY FUSION CERVICAL FOUR-FIVE (N/A)  Patient location during evaluation: PACU Anesthesia Type: General Level of consciousness: awake and alert Pain management: pain level controlled Vital Signs Assessment: post-procedure vital signs reviewed and stable Respiratory status: spontaneous breathing Cardiovascular status: blood pressure returned to baseline Anesthetic complications: no    Last Vitals:  Filed Vitals:   12/23/15 1600 12/23/15 1619  BP:  160/84  Pulse: 52 66  Temp: 36.6 C 36.6 C  Resp: 13 16    Last Pain:  Filed Vitals:   12/23/15 1632  PainSc: 4                  Tiajuana Amass

## 2015-12-23 NOTE — Op Note (Signed)
12/23/2015  2:21 PM  PATIENT:  Kevin Mcintyre  82 y.o. 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  ANESTHESIA:   general  EBL:  Total I/O In: 1000 [I.V.:1000] Out: 150 [Blood:150]  BLOOD ADMINISTERED:none  COUNT: Correct per nursing staff  DICTATION: Patient was brought to the operating room placed under general endotracheal anesthesia. Patient was placed in 10 pounds of halter traction. The neck was prepped with Betadine soap and solution and draped in a sterile fashion. A horizontal incision was made on the left side of the neck. The line of the incision was infiltrated with local anesthetic with epinephrine. Dissection was carried down thru the subcutaneous tissue and platysma, bipolar cautery was used to maintain hemostasis. Dissection was then carried down thru an avascular plane leaving the sternocleidomastoid carotid artery and jugular vein laterally and the trachea and esophagus medially. The ventral aspect of the vertebral column was identified and a localizing x-ray was taken. The C4-5 level was identified. The annulus was incised and the disc space entered. Discectomy was performed with micro-curettes and pituitary rongeurs. The operating microscope was draped and brought into the field provided additional magnification illumination and visualization. Discectomy was continued posteriorly thru the disc space and then the cartilaginous endplate was  removed using micro-curettes along with the high-speed drill. Posterior osteophytic overgrowth was removed using the high-speed drill along with a 2 mm thin footplated Kerrison punch. Posterior longitudinal ligament along with disc herniation was carefully removed, decompressing the spinal canal and thecal sac. We then continued to remove osteophytic overgrowth and disc material decompressing the neural foramina and exiting nerve roots bilaterally. Once the decompression was completed hemostasis was established with the use of Gelfoam with thrombin and bipolar cautery. The Gelfoam was removed the wound irrigated and hemostasis confirmed. We then measured the height of the intravertebral disc space and selected a 7 millimeter in height structural allograft. It was hydrated and saline solution and then gently positioned in the intravertebral disc space and countersunk. We then selected a 14 millimeter in height Aviator cervical plate. It was positioned over the fusion construct and secured to the vertebra with a pair of 4 x 14 mm self-tapping variable screws at the C4 level, and a pair of 4 x 14 mm self-tapping variable screws at the C5 level. Each screw hole was started with the high-speed drill and then the screws placed once all the screws were placed, the locking system was secured. The wound was irrigated with bacitracin solution checked for hemostasis which was established and confirmed. An x-ray was taken which showed the grafts in good position, the plate and screws in good position, and the overall alignment to be good. We then proceeded with closure. The platysma was closed with interrupted inverted 2-0 undyed Vicryl suture, the subcutaneous and subcuticular closed with interrupted inverted 3-0 undyed Vicryl suture. The skin edges were approximated with Dermabond. Following surgery the patient was taken out of cervical traction. To be reversed and the anesthetic and taken to the recovery room for further  care.  PLAN OF CARE: Admit for overnight observation  PATIENT  DISPOSITION:  PACU - hemodynamically stable.   Delay start of Pharmacological VTE agent (>24hrs) due to surgical blood loss or risk of bleeding:  yes

## 2015-12-23 NOTE — H&P (Signed)
Subjective: Patient is a 82 y.o. right-handed black male who is admitted for treatment of persistent left-sided neck pain.  Patient has advanced degenerative disease and spondylosis at the C4-5 level, with significant left C4-5 facet arthropathy. He did have a period of relief following a left C4-5 facet block. He is status post previous C5-6 and C6-7 ACDF 20 years ago. He is admitted now for C4-5 anterior cervical decompression and arthrodesis with structural allograft and cervical plating.   Patient Active Problem List   Diagnosis Date Noted  . Acute upper respiratory infection 10/29/2014  . HTN (hypertension) 11/01/2012   Past Medical History  Diagnosis Date  . Hypertension   . Back problem   . GERD (gastroesophageal reflux disease)   . H/O exercise stress test 2012    pt. told that it was wnl  . Arthritis     hip- R- Feels a 'little catch in his hip"    Past Surgical History  Procedure Laterality Date  . Spine surgery    . Cholecystectomy    . Cervical fusion    . Hernia repair Left 1990's    inguinal   . Back surgery      x3 lumbar surgeries, X1 cervical fusion as well    No prescriptions prior to admission   Allergies  Allergen Reactions  . Shellfish Allergy Nausea Only    Shrimp - ok    Social History  Substance Use Topics  . Smoking status: Never Smoker   . Smokeless tobacco: Not on file  . Alcohol Use: No    No family history on file.   Review of Systems A comprehensive review of systems was negative.  Objective: Vital signs in last 24 hours:    EXAM: Patient is a well-developed well-nourished black male in no acute distress. Lungs are clear to auscultation , the patient has symmetrical respiratory excursion. Heart has a regular rate and rhythm normal S1 and S2 no murmur.   Abdomen is soft nontender nondistended bowel sounds are present. Extremity examination shows no clubbing cyanosis or edema. Motor examination shows 5 over 5 strength in the upper  extremities including the deltoid biceps triceps and intrinsics and grip. Sensation is intact to pinprick throughout the digits of the upper extremities. Reflexes are symmetrical and without evidence of pathologic reflexes. Patient has a normal gait and stance.   Data Review:CBC    Component Value Date/Time   WBC 6.7 12/17/2015 1546   RBC 4.70 12/17/2015 1546   HGB 14.3 12/17/2015 1546   HCT 43.3 12/17/2015 1546   PLT 234 12/17/2015 1546   MCV 92.1 12/17/2015 1546   MCH 30.4 12/17/2015 1546   MCHC 33.0 12/17/2015 1546   RDW 13.9 12/17/2015 1546                          BMET    Component Value Date/Time   NA 139 12/17/2015 1546   K 3.7 12/17/2015 1546   CL 100* 12/17/2015 1546   CO2 28 12/17/2015 1546   GLUCOSE 95 12/17/2015 1546   BUN 11 12/17/2015 1546   CREATININE 1.06 12/17/2015 1546   CALCIUM 9.4 12/17/2015 1546   GFRNONAA >60 12/17/2015 1546   GFRAA >60 12/17/2015 1546     Assessment/Plan: Patient with persistent left-sided neck pain secondary to advanced degeneration at the C4-5 level who is admitted for C4-5 ACDF.  I've discussed with the patient the nature of his condition, the nature the surgical procedure,  the typical length of surgery, hospital stay, and overall recuperation. We discussed limitations postoperatively. I discussed risks of surgery including risks of infection, bleeding, possibly need for transfusion, the risk of nerve root dysfunction with pain, weakness, numbness, or paresthesias, the risk of spinal cord dysfunction with paralysis of all 4 limbs and quadriplegia, and the risk of dural tear and CSF leakage and possible need for further surgery, the risk of esophageal dysfunction causing dysphagia and the risk of laryngeal dysfunction causing hoarseness of the voice, the risk of failure of the arthrodesis and the possible need for further surgery, and the risk of anesthetic complications including myocardial infarction, stroke, pneumonia, and death. We also  discussed the need for postoperative immobilization in a cervical collar. Understanding all this the patient does wish to proceed with surgery and is admitted for such.    Hosie Spangle, MD 12/23/2015 7:25 AM

## 2015-12-23 NOTE — Transfer of Care (Signed)
Immediate Anesthesia Transfer of Care Note  Patient: Kevin Mcintyre  Procedure(s) Performed: Procedure(s): ANTERIOR CERVICAL DECOMPRESSION/DISCECTOMY FUSION CERVICAL FOUR-FIVE (N/A)  Patient Location: PACU  Anesthesia Type:General  Level of Consciousness: awake and alert   Airway & Oxygen Therapy: Patient Spontanous Breathing and Patient connected to nasal cannula oxygen  Post-op Assessment: Report given to RN, Post -op Vital signs reviewed and stable and Patient moving all extremities X 4  Post vital signs: Reviewed  Last Vitals:  Filed Vitals:   12/23/15 0954 12/23/15 1436  BP: 145/85   Pulse: 66   Temp: 36.3 C 36.4 C  Resp: 18     Complications: No apparent anesthesia complications

## 2015-12-24 ENCOUNTER — Encounter (HOSPITAL_COMMUNITY): Payer: Self-pay | Admitting: Neurosurgery

## 2015-12-24 DIAGNOSIS — M50121 Cervical disc disorder at C4-C5 level with radiculopathy: Secondary | ICD-10-CM | POA: Diagnosis not present

## 2015-12-24 DIAGNOSIS — I10 Essential (primary) hypertension: Secondary | ICD-10-CM | POA: Diagnosis not present

## 2015-12-24 MED ORDER — HYDROCODONE-ACETAMINOPHEN 5-325 MG PO TABS: 1.0000 | ORAL_TABLET | ORAL | Status: DC | PRN

## 2015-12-24 NOTE — Discharge Summary (Signed)
Physician Discharge Summary  Patient ID: Kevin Mcintyre MRN: [id number] DOB/AGE: 06-01-1934 82 y.o.  Admit date: 12/23/2015 Discharge date: 12/24/2015  Admission Diagnoses:   Cervical spondylosis, left C4-5 facet arthropathy, C4-5 spondylitic disc herniation, cervical degenerative disc disease, cervical radiculopathy  Discharge Diagnoses:  Cervical spondylosis, left C4-5 facet arthropathy, C4-5 spondylitic disc herniation, cervical degenerative disc disease, cervical radiculopathy  Active Problems:   HNP (herniated nucleus pulposus), cervical   Discharged Condition: good  Hospital Course: Patient admitted, underwent a C4-5 ACDF. Postoperatively he has done well. His up and ambulating actively in the halls. His incision is healing nicely. He is being discharged home with instructions regarding wound care and activities. He is scheduled to follow-up with me in the office in 3 weeks.  Discharge Exam: Blood pressure 153/79, pulse 72, temperature 98.1 F (36.7 C), temperature source Oral, resp. rate 16, weight 83.462 kg (184 lb), SpO2 98 %.  Disposition: 01-Home or Self Care     Medication List    TAKE these medications        amLODipine 5 MG tablet  Commonly known as:  NORVASC  Take 5 mg by mouth daily before breakfast.     aspirin 81 MG tablet  Take 81 mg by mouth daily.     docusate sodium 100 MG capsule  Commonly known as:  COLACE  Take 200 mg by mouth daily.     fluticasone 50 MCG/ACT nasal spray  Commonly known as:  FLONASE  Place 2 sprays into both nostrils daily.     HYDROcodone-acetaminophen 5-325 MG tablet  Commonly known as:  NORCO/VICODIN  Take 1-2 tablets by mouth every 4 (four) hours as needed (mild pain).     meloxicam 15 MG tablet  Commonly known as:  MOBIC  Take 15 mg by mouth daily.     multivitamin with minerals tablet  Take 1 tablet by mouth daily.     olmesartan-hydrochlorothiazide 40-12.5 MG tablet  Commonly known as:  BENICAR HCT  Take 1  tablet by mouth daily.     pantoprazole 40 MG tablet  Commonly known as:  PROTONIX  Take 40 mg by mouth 2 (two) times daily.     ranitidine 300 MG tablet  Commonly known as:  ZANTAC  Take 300 mg by mouth at bedtime.     traMADol 50 MG tablet  Commonly known as:  ULTRAM  Take 50-100 mg by mouth every 6 (six) hours as needed for moderate pain.         SignedHosie Spangle 12/24/2015, 8:18 AM

## 2015-12-24 NOTE — Discharge Instructions (Signed)

## 2015-12-24 NOTE — Progress Notes (Signed)
Patient alert and oriented, mae's well, voiding adequate amount of urine, swallowing without difficulty, no c/o pain. Patient discharged home with family. Script and discharged instructions given to patient. Patient and family stated understanding of d/c instructions given and has an appointment with MD. 

## 2015-12-28 DIAGNOSIS — Z6828 Body mass index (BMI) 28.0-28.9, adult: Secondary | ICD-10-CM | POA: Diagnosis not present

## 2015-12-28 DIAGNOSIS — Z981 Arthrodesis status: Secondary | ICD-10-CM | POA: Diagnosis not present

## 2015-12-28 DIAGNOSIS — M4722 Other spondylosis with radiculopathy, cervical region: Secondary | ICD-10-CM | POA: Diagnosis not present

## 2015-12-28 DIAGNOSIS — M5412 Radiculopathy, cervical region: Secondary | ICD-10-CM | POA: Diagnosis not present

## 2015-12-28 DIAGNOSIS — I10 Essential (primary) hypertension: Secondary | ICD-10-CM | POA: Diagnosis not present

## 2015-12-28 DIAGNOSIS — M5030 Other cervical disc degeneration, unspecified cervical region: Secondary | ICD-10-CM | POA: Diagnosis not present

## 2016-02-16 DIAGNOSIS — R0981 Nasal congestion: Secondary | ICD-10-CM | POA: Diagnosis not present

## 2016-02-16 DIAGNOSIS — M47812 Spondylosis without myelopathy or radiculopathy, cervical region: Secondary | ICD-10-CM | POA: Diagnosis not present

## 2016-02-16 DIAGNOSIS — K219 Gastro-esophageal reflux disease without esophagitis: Secondary | ICD-10-CM | POA: Diagnosis not present

## 2016-02-16 DIAGNOSIS — M545 Low back pain: Secondary | ICD-10-CM | POA: Diagnosis not present

## 2016-02-16 DIAGNOSIS — I10 Essential (primary) hypertension: Secondary | ICD-10-CM | POA: Diagnosis not present

## 2016-03-14 DIAGNOSIS — M4722 Other spondylosis with radiculopathy, cervical region: Secondary | ICD-10-CM | POA: Diagnosis not present

## 2016-03-14 DIAGNOSIS — Z981 Arthrodesis status: Secondary | ICD-10-CM | POA: Diagnosis not present

## 2016-03-14 DIAGNOSIS — M5030 Other cervical disc degeneration, unspecified cervical region: Secondary | ICD-10-CM | POA: Diagnosis not present

## 2016-03-23 ENCOUNTER — Ambulatory Visit (INDEPENDENT_AMBULATORY_CARE_PROVIDER_SITE_OTHER): Payer: Medicare Other | Admitting: Family Medicine

## 2016-03-23 VITALS — BP 122/76 | HR 68 | Temp 97.9°F | Resp 18 | Ht 69.0 in | Wt 184.0 lb

## 2016-03-23 DIAGNOSIS — J029 Acute pharyngitis, unspecified: Secondary | ICD-10-CM

## 2016-03-23 DIAGNOSIS — J301 Allergic rhinitis due to pollen: Secondary | ICD-10-CM

## 2016-03-23 LAB — POCT RAPID STREP A (OFFICE): Rapid Strep A Screen: NEGATIVE

## 2016-03-23 MED ORDER — AMOXICILLIN 500 MG PO TABS: 1000.0000 mg | ORAL_TABLET | Freq: Two times a day (BID) | ORAL | Status: DC

## 2016-03-23 MED ORDER — AZELASTINE HCL 0.1 % NA SOLN: 2.0000 | Freq: Two times a day (BID) | NASAL | Status: DC

## 2016-03-23 NOTE — Progress Notes (Signed)
Subjective:    Patient ID: Kevin Mcintyre, male    DOB: [birth year], 82 y.o.   MRN: [id number]  03/23/2016  Sore Throat   HPI This 82 y.o. male presents for evaluation of sore throat, PND.  Onset since February 2017.  No fever/chills/sweats.  No headache. NO ear pain. +ST for 3 days; got really sore throat; s/p fusion surgery in March 2017.  This sore throat feels different; irritated in throat.  Swallowing pills cannot feel L side due to nerve injury.  PND for three months; throat clearing for three months.  Mild rhinonrrhea; mild nasal congestion; yellow mucous; cough with sputum yellow green intermittent.  Usually light yellow.  No cough.  No SOB.  Using Flonase.  No claritin or zyrtec.  No pain with swallowing; worse at night.  Mornings are severe as well. Gargle with water as well.     Review of Systems  Constitutional: Negative for fever, chills, diaphoresis, activity change, appetite change and fatigue.  HENT: Positive for postnasal drip, rhinorrhea and sore throat. Negative for ear pain, trouble swallowing and voice change.   Respiratory: Negative for cough and shortness of breath.   Cardiovascular: Negative for chest pain, palpitations and leg swelling.  Gastrointestinal: Negative for nausea, vomiting, abdominal pain and diarrhea.  Endocrine: Negative for cold intolerance, heat intolerance, polydipsia, polyphagia and polyuria.  Skin: Negative for color change, rash and wound.  Neurological: Negative for dizziness, tremors, seizures, syncope, facial asymmetry, speech difficulty, weakness, light-headedness, numbness and headaches.  Psychiatric/Behavioral: Negative for sleep disturbance and dysphoric mood. The patient is not nervous/anxious.     Past Medical History  Diagnosis Date  . Hypertension   . Back problem   . GERD (gastroesophageal reflux disease)   . H/O exercise stress test 2012    pt. told that it was wnl  . Arthritis     hip- R- Feels a 'little catch in his hip"     Past Surgical History  Procedure Laterality Date  . Spine surgery    . Cholecystectomy    . Cervical fusion    . Hernia repair Left 1990's    inguinal   . Back surgery      x3 lumbar surgeries, X1 cervical fusion as well  . Anterior cervical decomp/discectomy fusion N/A 12/23/2015    Procedure: ANTERIOR CERVICAL DECOMPRESSION/DISCECTOMY FUSION CERVICAL FOUR-FIVE;  Surgeon: Jovita Gamma, MD;  Location: [facility];  Service: Neurosurgery;  Laterality: N/A;   Allergies  Allergen Reactions  . Shellfish Allergy Nausea Only    Shrimp - ok    Social History   Social History  . Marital Status: Married    Spouse Name: N/A  . Number of Children: N/A  . Years of Education: N/A   Occupational History  . Not on file.   Social History Main Topics  . Smoking status: Never Smoker   . Smokeless tobacco: Not on file  . Alcohol Use: No  . Drug Use: No  . Sexual Activity: Not on file   Other Topics Concern  . Not on file   Social History Narrative   History reviewed. No pertinent family history.     Objective:    BP 122/76 mmHg  Pulse 68  Temp(Src) 97.9 F (36.6 C) (Oral)  Resp 18  Ht 5\' 9"  (1.753 m)  Wt 184 lb (83.462 kg)  BMI 27.16 kg/m2  SpO2 96% Physical Exam  Constitutional: He is oriented to person, place, and time. He appears well-developed and well-nourished. No  distress.  HENT:  Head: Normocephalic and atraumatic.  Right Ear: External ear normal.  Left Ear: External ear normal.  Nose: Nose normal.  Mouth/Throat: Oropharynx is clear and moist.  Eyes: Conjunctivae and EOM are normal. Pupils are equal, round, and reactive to light.  Neck: Normal range of motion. Neck supple. Carotid bruit is not present. No thyromegaly present.  Cardiovascular: Normal rate, regular rhythm, normal heart sounds and intact distal pulses.  Exam reveals no gallop and no friction rub.   No murmur heard. Pulmonary/Chest: Effort normal and breath sounds normal. He has no wheezes.  He has no rales.  Abdominal: Soft. Bowel sounds are normal. He exhibits no distension and no mass. There is no tenderness. There is no rebound and no guarding.  Lymphadenopathy:    He has no cervical adenopathy.  Neurological: He is alert and oriented to person, place, and time. No cranial nerve deficit.  Skin: Skin is warm and dry. No rash noted. He is not diaphoretic.  Psychiatric: He has a normal mood and affect. His behavior is normal.  Nursing note and vitals reviewed.       Assessment & Plan:   1. Sore throat   2. Allergic rhinitis due to pollen     Orders Placed This Encounter  Procedures  . Culture, Group A Strep    Order Specific Question:  Source    Answer:  oropharynx  . POCT rapid strep A   Meds ordered this encounter  Medications  . azelastine (ASTELIN) 0.1 % nasal spray    Sig: Place 2 sprays into both nostrils 2 (two) times daily. Use in each nostril as directed    Dispense:  30 mL    Refill:  12  . amoxicillin (AMOXIL) 500 MG tablet    Sig: Take 2 tablets (1,000 mg total) by mouth 2 (two) times daily.    Dispense:  40 tablet    Refill:  0    No Follow-up on file.    Mckyle Solanki Elayne Guerin, M.D. [facility] [address] [place], [place]  [phone] phone [phone] fax

## 2016-03-23 NOTE — Patient Instructions (Addendum)
   IF you received an x-ray today, you will receive an invoice from [facility]. Please contact [facility] at [phone] with questions or concerns regarding your invoice.   IF you received labwork today, you will receive an invoice from [facility]. Please contact [facility] at [phone] with questions or concerns regarding your invoice.   Our billing staff will not be able to assist you with questions regarding bills from these companies.  You will be contacted with the lab results as soon as they are available. The fastest way to get your results is to activate your My Chart account. Instructions are located on the last page of this paperwork. If you have not heard from us regarding the results in 2 weeks, please contact this office.    Allergic Rhinitis Allergic rhinitis is when the mucous membranes in the nose respond to allergens. Allergens are particles in the air that cause your body to have an allergic reaction. This causes you to release allergic antibodies. Through a chain of events, these eventually cause you to release histamine into the blood stream. Although meant to protect the body, it is this release of histamine that causes your discomfort, such as frequent sneezing, congestion, and an itchy, runny nose.  CAUSES Seasonal allergic rhinitis (hay fever) is caused by pollen allergens that may come from grasses, trees, and weeds. Year-round allergic rhinitis (perennial allergic rhinitis) is caused by allergens such as house dust mites, pet dander, and mold spores. SYMPTOMS  Nasal stuffiness (congestion).  Itchy, runny nose with sneezing and tearing of the eyes. DIAGNOSIS Your health care provider can help you determine the allergen or allergens that trigger your symptoms. If you and your health care provider are unable to determine the allergen, skin or blood testing may be used. Your health care provider will diagnose your  condition after taking your health history and performing a physical exam. Your health care provider may assess you for other related conditions, such as asthma, pink eye, or an ear infection. TREATMENT Allergic rhinitis does not have a cure, but it can be controlled by:  Medicines that block allergy symptoms. These may include allergy shots, nasal sprays, and oral antihistamines.  Avoiding the allergen. Hay fever may often be treated with antihistamines in pill or nasal spray forms. Antihistamines block the effects of histamine. There are over-the-counter medicines that may help with nasal congestion and swelling around the eyes. Check with your health care provider before taking or giving this medicine. If avoiding the allergen or the medicine prescribed do not work, there are many new medicines your health care provider can prescribe. Stronger medicine may be used if initial measures are ineffective. Desensitizing injections can be used if medicine and avoidance does not work. Desensitization is when a patient is given ongoing shots until the body becomes less sensitive to the allergen. Make sure you follow up with your health care provider if problems continue. HOME CARE INSTRUCTIONS It is not possible to completely avoid allergens, but you can reduce your symptoms by taking steps to limit your exposure to them. It helps to know exactly what you are allergic to so that you can avoid your specific triggers. SEEK MEDICAL CARE IF:  You have a fever.  You develop a cough that does not stop easily (persistent).  You have shortness of breath.  You start wheezing.  Symptoms interfere with normal daily activities.   This information is not intended to replace advice given to you by your   health care provider. Make sure you discuss any questions you have with your health care provider.   Document Released: 07/04/2001 Document Revised: 10/30/2014 Document Reviewed: 06/16/2013 Elsevier Interactive  Patient Education 2016 [employer]  

## 2016-03-25 LAB — CULTURE, GROUP A STREP: ORGANISM ID, BACTERIA: NORMAL

## 2016-06-15 ENCOUNTER — Other Ambulatory Visit: Payer: Self-pay | Admitting: Physician Assistant

## 2016-06-23 ENCOUNTER — Other Ambulatory Visit: Payer: Self-pay | Admitting: Physician Assistant

## 2016-07-04 DIAGNOSIS — H2513 Age-related nuclear cataract, bilateral: Secondary | ICD-10-CM | POA: Diagnosis not present

## 2016-07-04 DIAGNOSIS — H40013 Open angle with borderline findings, low risk, bilateral: Secondary | ICD-10-CM | POA: Diagnosis not present

## 2016-07-04 DIAGNOSIS — H43813 Vitreous degeneration, bilateral: Secondary | ICD-10-CM | POA: Diagnosis not present

## 2016-07-11 DIAGNOSIS — I10 Essential (primary) hypertension: Secondary | ICD-10-CM | POA: Diagnosis not present

## 2016-07-11 DIAGNOSIS — E559 Vitamin D deficiency, unspecified: Secondary | ICD-10-CM | POA: Diagnosis not present

## 2016-07-11 DIAGNOSIS — M542 Cervicalgia: Secondary | ICD-10-CM | POA: Diagnosis not present

## 2016-07-11 DIAGNOSIS — Z981 Arthrodesis status: Secondary | ICD-10-CM | POA: Diagnosis not present

## 2016-07-11 DIAGNOSIS — S129XXA Fracture of neck, unspecified, initial encounter: Secondary | ICD-10-CM | POA: Diagnosis not present

## 2016-07-11 DIAGNOSIS — M4722 Other spondylosis with radiculopathy, cervical region: Secondary | ICD-10-CM | POA: Diagnosis not present

## 2016-07-11 DIAGNOSIS — M5030 Other cervical disc degeneration, unspecified cervical region: Secondary | ICD-10-CM | POA: Diagnosis not present

## 2016-07-11 DIAGNOSIS — Z6828 Body mass index (BMI) 28.0-28.9, adult: Secondary | ICD-10-CM | POA: Diagnosis not present

## 2016-08-14 DIAGNOSIS — Z0000 Encounter for general adult medical examination without abnormal findings: Secondary | ICD-10-CM | POA: Diagnosis not present

## 2016-08-14 DIAGNOSIS — K219 Gastro-esophageal reflux disease without esophagitis: Secondary | ICD-10-CM | POA: Diagnosis not present

## 2016-08-14 DIAGNOSIS — Z23 Encounter for immunization: Secondary | ICD-10-CM | POA: Diagnosis not present

## 2016-08-14 DIAGNOSIS — I10 Essential (primary) hypertension: Secondary | ICD-10-CM | POA: Diagnosis not present

## 2016-08-14 DIAGNOSIS — Z1389 Encounter for screening for other disorder: Secondary | ICD-10-CM | POA: Diagnosis not present

## 2016-08-14 DIAGNOSIS — M545 Low back pain: Secondary | ICD-10-CM | POA: Diagnosis not present

## 2016-08-14 DIAGNOSIS — J309 Allergic rhinitis, unspecified: Secondary | ICD-10-CM | POA: Diagnosis not present

## 2016-08-14 DIAGNOSIS — M47812 Spondylosis without myelopathy or radiculopathy, cervical region: Secondary | ICD-10-CM | POA: Diagnosis not present

## 2017-01-09 DIAGNOSIS — R498 Other voice and resonance disorders: Secondary | ICD-10-CM | POA: Diagnosis not present

## 2017-01-09 DIAGNOSIS — Z981 Arthrodesis status: Secondary | ICD-10-CM | POA: Diagnosis not present

## 2017-01-09 DIAGNOSIS — S129XXA Fracture of neck, unspecified, initial encounter: Secondary | ICD-10-CM | POA: Diagnosis not present

## 2017-01-09 DIAGNOSIS — R1310 Dysphagia, unspecified: Secondary | ICD-10-CM | POA: Diagnosis not present

## 2017-02-13 DIAGNOSIS — Z6827 Body mass index (BMI) 27.0-27.9, adult: Secondary | ICD-10-CM | POA: Diagnosis not present

## 2017-02-13 DIAGNOSIS — M545 Low back pain: Secondary | ICD-10-CM | POA: Diagnosis not present

## 2017-02-13 DIAGNOSIS — J309 Allergic rhinitis, unspecified: Secondary | ICD-10-CM | POA: Diagnosis not present

## 2017-02-13 DIAGNOSIS — K219 Gastro-esophageal reflux disease without esophagitis: Secondary | ICD-10-CM | POA: Diagnosis not present

## 2017-02-13 DIAGNOSIS — I10 Essential (primary) hypertension: Secondary | ICD-10-CM | POA: Diagnosis not present

## 2017-02-13 DIAGNOSIS — M47812 Spondylosis without myelopathy or radiculopathy, cervical region: Secondary | ICD-10-CM | POA: Diagnosis not present

## 2017-02-16 IMAGING — CR DG CERVICAL SPINE 2 OR 3 VIEWS
1 series · 1 of 1 positions shown · non-contrast
Comparison: MR 11/25/2015

CLINICAL DATA: C4-5 ACDF

EXAM:
CERVICAL SPINE - 2-3 VIEW

[lat]
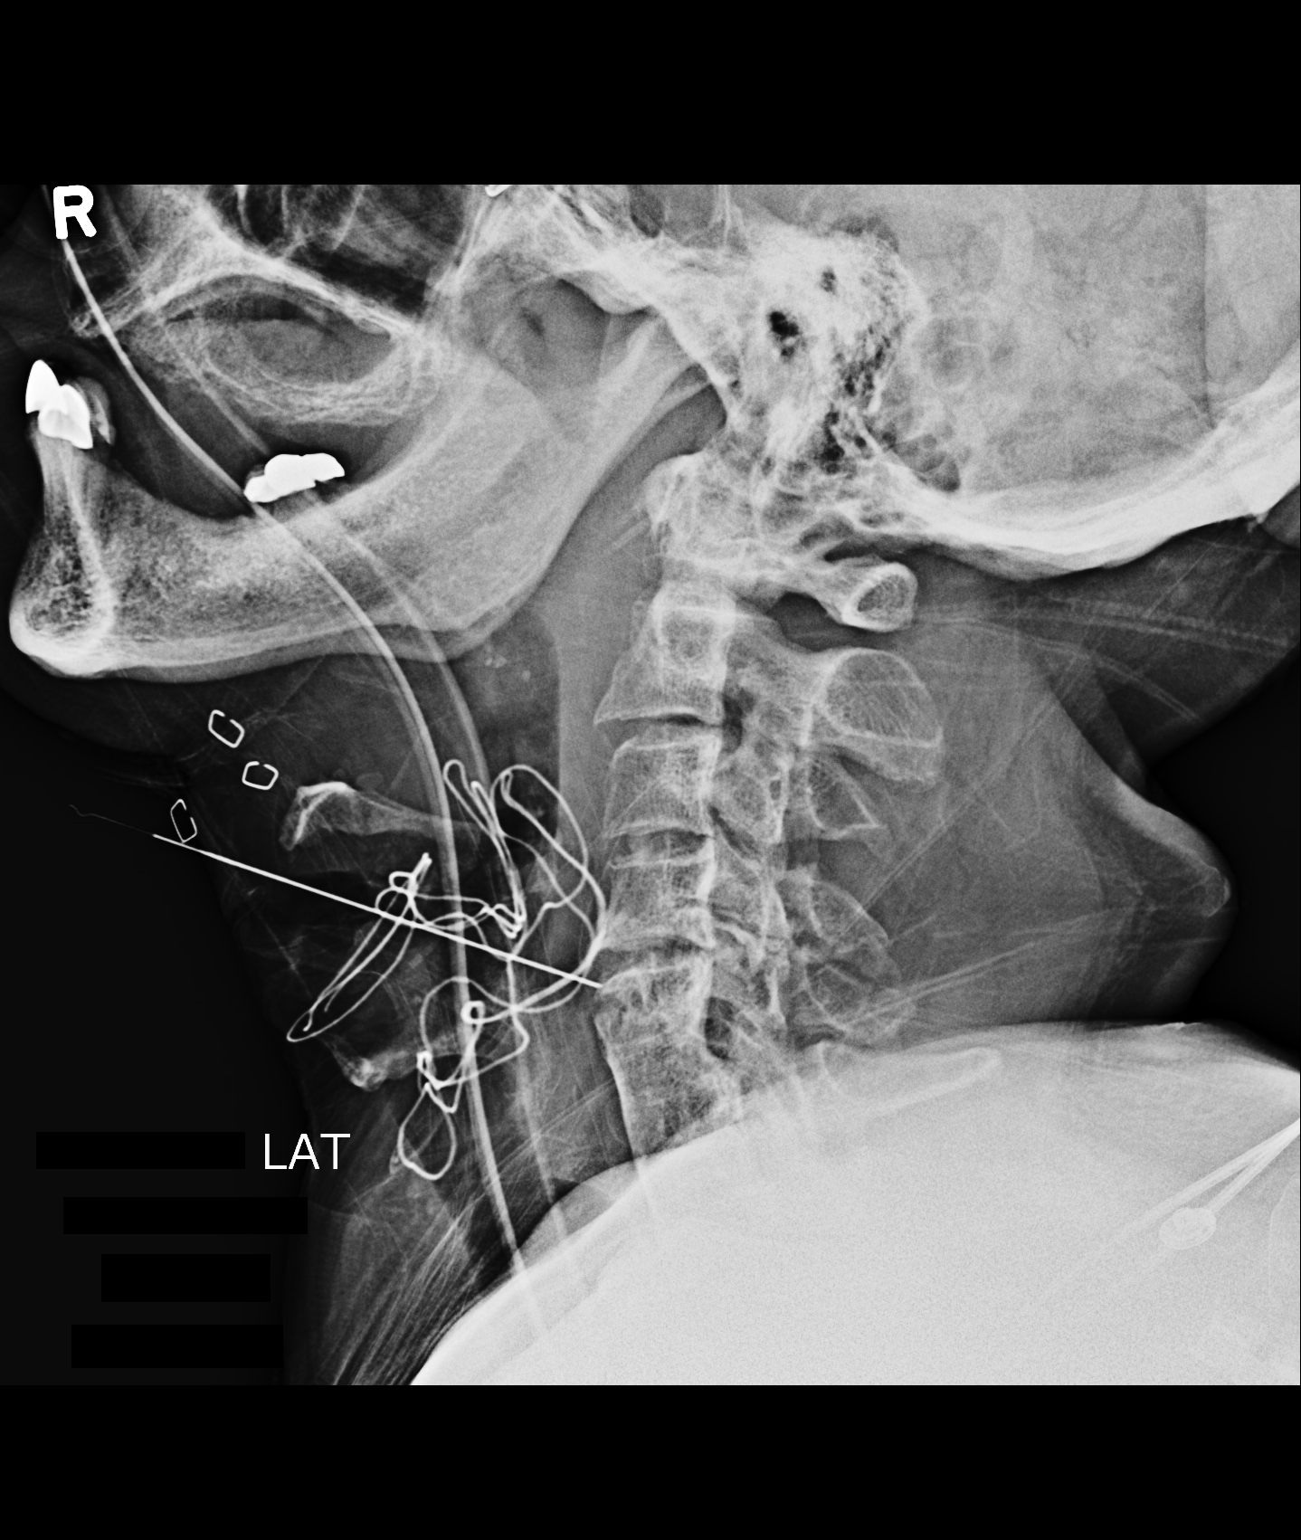

[1 of 1 positions shown; findings below may reference images not displayed]

FINDINGS: First lateral intraoperative cervical radiograph shows needle from
anterior approach, tip at the anterior margin of the C4-5
interspace. Subsequent image shows changes of instrumented ACDF
C4-5.
IMPRESSION: ACDF C4-5.

## 2017-05-31 DIAGNOSIS — I10 Essential (primary) hypertension: Secondary | ICD-10-CM | POA: Diagnosis not present

## 2017-05-31 DIAGNOSIS — R109 Unspecified abdominal pain: Secondary | ICD-10-CM | POA: Diagnosis not present

## 2017-08-20 DIAGNOSIS — R1013 Epigastric pain: Secondary | ICD-10-CM | POA: Diagnosis not present

## 2017-08-20 DIAGNOSIS — Z23 Encounter for immunization: Secondary | ICD-10-CM | POA: Diagnosis not present

## 2017-08-20 DIAGNOSIS — Z6825 Body mass index (BMI) 25.0-25.9, adult: Secondary | ICD-10-CM | POA: Diagnosis not present

## 2017-08-20 DIAGNOSIS — Z1389 Encounter for screening for other disorder: Secondary | ICD-10-CM | POA: Diagnosis not present

## 2017-08-20 DIAGNOSIS — I10 Essential (primary) hypertension: Secondary | ICD-10-CM | POA: Diagnosis not present

## 2017-08-20 DIAGNOSIS — K219 Gastro-esophageal reflux disease without esophagitis: Secondary | ICD-10-CM | POA: Diagnosis not present

## 2017-08-20 DIAGNOSIS — Z1211 Encounter for screening for malignant neoplasm of colon: Secondary | ICD-10-CM | POA: Diagnosis not present

## 2017-08-20 DIAGNOSIS — Z0000 Encounter for general adult medical examination without abnormal findings: Secondary | ICD-10-CM | POA: Diagnosis not present

## 2017-08-20 DIAGNOSIS — M47812 Spondylosis without myelopathy or radiculopathy, cervical region: Secondary | ICD-10-CM | POA: Diagnosis not present

## 2017-08-20 DIAGNOSIS — M545 Low back pain: Secondary | ICD-10-CM | POA: Diagnosis not present

## 2017-08-20 DIAGNOSIS — J309 Allergic rhinitis, unspecified: Secondary | ICD-10-CM | POA: Diagnosis not present

## 2017-08-28 ENCOUNTER — Other Ambulatory Visit: Payer: Self-pay | Admitting: Gastroenterology

## 2017-08-28 DIAGNOSIS — R1033 Periumbilical pain: Secondary | ICD-10-CM | POA: Diagnosis not present

## 2017-08-29 DIAGNOSIS — H40023 Open angle with borderline findings, high risk, bilateral: Secondary | ICD-10-CM | POA: Diagnosis not present

## 2017-08-31 ENCOUNTER — Ambulatory Visit
Admission: RE | Admit: 2017-08-31 | Discharge: 2017-08-31 | Disposition: A | Discharge status: Home / Self Care | Payer: Medicare Other | Source: Ambulatory Visit | Attending: Gastroenterology | Admitting: Gastroenterology

## 2017-08-31 DIAGNOSIS — R1033 Periumbilical pain: Secondary | ICD-10-CM

## 2017-08-31 DIAGNOSIS — K429 Umbilical hernia without obstruction or gangrene: Secondary | ICD-10-CM | POA: Diagnosis not present

## 2017-08-31 MED ORDER — IOPAMIDOL (ISOVUE-300) INJECTION 61%
100.0000 mL | Freq: Once | INTRAVENOUS | Status: AC | PRN
  Administered 2017-08-31: 100 mL via INTRAVENOUS

## 2017-09-07 DIAGNOSIS — K5900 Constipation, unspecified: Secondary | ICD-10-CM | POA: Diagnosis not present

## 2017-09-18 DIAGNOSIS — K640 First degree hemorrhoids: Secondary | ICD-10-CM | POA: Diagnosis not present

## 2017-09-18 DIAGNOSIS — K5730 Diverticulosis of large intestine without perforation or abscess without bleeding: Secondary | ICD-10-CM | POA: Diagnosis not present

## 2017-09-18 DIAGNOSIS — D126 Benign neoplasm of colon, unspecified: Secondary | ICD-10-CM | POA: Diagnosis not present

## 2017-09-18 DIAGNOSIS — R1033 Periumbilical pain: Secondary | ICD-10-CM | POA: Diagnosis not present

## 2017-09-18 DIAGNOSIS — K219 Gastro-esophageal reflux disease without esophagitis: Secondary | ICD-10-CM | POA: Diagnosis not present

## 2017-09-20 DIAGNOSIS — D126 Benign neoplasm of colon, unspecified: Secondary | ICD-10-CM | POA: Diagnosis not present

## 2017-09-28 DIAGNOSIS — J209 Acute bronchitis, unspecified: Secondary | ICD-10-CM | POA: Diagnosis not present

## 2017-10-09 DIAGNOSIS — M47816 Spondylosis without myelopathy or radiculopathy, lumbar region: Secondary | ICD-10-CM | POA: Diagnosis not present

## 2017-10-09 DIAGNOSIS — M4155 Other secondary scoliosis, thoracolumbar region: Secondary | ICD-10-CM | POA: Diagnosis not present

## 2017-10-09 DIAGNOSIS — M546 Pain in thoracic spine: Secondary | ICD-10-CM | POA: Diagnosis not present

## 2017-10-09 DIAGNOSIS — M5416 Radiculopathy, lumbar region: Secondary | ICD-10-CM | POA: Diagnosis not present

## 2017-10-09 DIAGNOSIS — Z981 Arthrodesis status: Secondary | ICD-10-CM | POA: Diagnosis not present

## 2017-10-09 DIAGNOSIS — M25552 Pain in left hip: Secondary | ICD-10-CM | POA: Diagnosis not present

## 2017-10-09 DIAGNOSIS — M5136 Other intervertebral disc degeneration, lumbar region: Secondary | ICD-10-CM | POA: Diagnosis not present

## 2017-10-19 DIAGNOSIS — M5416 Radiculopathy, lumbar region: Secondary | ICD-10-CM | POA: Diagnosis not present

## 2017-10-24 DIAGNOSIS — M5416 Radiculopathy, lumbar region: Secondary | ICD-10-CM | POA: Diagnosis not present

## 2017-10-24 DIAGNOSIS — M25552 Pain in left hip: Secondary | ICD-10-CM | POA: Diagnosis not present

## 2017-10-29 DIAGNOSIS — C4022 Malignant neoplasm of long bones of left lower limb: Secondary | ICD-10-CM | POA: Diagnosis not present

## 2017-10-29 DIAGNOSIS — Z981 Arthrodesis status: Secondary | ICD-10-CM | POA: Diagnosis not present

## 2017-10-29 DIAGNOSIS — M25552 Pain in left hip: Secondary | ICD-10-CM | POA: Diagnosis not present

## 2017-11-23 DIAGNOSIS — K209 Esophagitis, unspecified: Secondary | ICD-10-CM | POA: Diagnosis not present

## 2017-11-23 DIAGNOSIS — R2681 Unsteadiness on feet: Secondary | ICD-10-CM | POA: Diagnosis not present

## 2017-11-23 DIAGNOSIS — Z96642 Presence of left artificial hip joint: Secondary | ICD-10-CM | POA: Diagnosis not present

## 2017-11-23 DIAGNOSIS — Z5189 Encounter for other specified aftercare: Secondary | ICD-10-CM | POA: Diagnosis not present

## 2017-11-23 DIAGNOSIS — M545 Low back pain: Secondary | ICD-10-CM | POA: Diagnosis not present

## 2017-11-23 DIAGNOSIS — R05 Cough: Secondary | ICD-10-CM | POA: Diagnosis not present

## 2017-11-23 DIAGNOSIS — R2689 Other abnormalities of gait and mobility: Secondary | ICD-10-CM | POA: Diagnosis not present

## 2017-11-23 DIAGNOSIS — M6281 Muscle weakness (generalized): Secondary | ICD-10-CM | POA: Diagnosis not present

## 2017-11-23 DIAGNOSIS — M899 Disorder of bone, unspecified: Secondary | ICD-10-CM | POA: Diagnosis not present

## 2017-11-23 DIAGNOSIS — I959 Hypotension, unspecified: Secondary | ICD-10-CM | POA: Diagnosis not present

## 2017-11-23 DIAGNOSIS — K219 Gastro-esophageal reflux disease without esophagitis: Secondary | ICD-10-CM | POA: Diagnosis not present

## 2017-11-23 DIAGNOSIS — Z9889 Other specified postprocedural states: Secondary | ICD-10-CM | POA: Diagnosis not present

## 2017-11-23 DIAGNOSIS — Z471 Aftercare following joint replacement surgery: Secondary | ICD-10-CM | POA: Diagnosis not present

## 2017-11-23 DIAGNOSIS — R488 Other symbolic dysfunctions: Secondary | ICD-10-CM | POA: Diagnosis not present

## 2017-11-23 DIAGNOSIS — I10 Essential (primary) hypertension: Secondary | ICD-10-CM | POA: Diagnosis not present

## 2017-11-23 DIAGNOSIS — D649 Anemia, unspecified: Secondary | ICD-10-CM | POA: Diagnosis not present

## 2017-11-23 DIAGNOSIS — C4022 Malignant neoplasm of long bones of left lower limb: Secondary | ICD-10-CM | POA: Diagnosis not present

## 2017-11-23 DIAGNOSIS — R278 Other lack of coordination: Secondary | ICD-10-CM | POA: Diagnosis not present

## 2017-11-23 DIAGNOSIS — D480 Neoplasm of uncertain behavior of bone and articular cartilage: Secondary | ICD-10-CM | POA: Diagnosis not present

## 2017-11-23 DIAGNOSIS — R41841 Cognitive communication deficit: Secondary | ICD-10-CM | POA: Diagnosis not present

## 2017-11-23 DIAGNOSIS — M25552 Pain in left hip: Secondary | ICD-10-CM | POA: Diagnosis not present

## 2017-11-27 DIAGNOSIS — R2681 Unsteadiness on feet: Secondary | ICD-10-CM | POA: Diagnosis not present

## 2017-11-27 DIAGNOSIS — M25552 Pain in left hip: Secondary | ICD-10-CM | POA: Diagnosis not present

## 2017-11-27 DIAGNOSIS — M6281 Muscle weakness (generalized): Secondary | ICD-10-CM | POA: Diagnosis not present

## 2017-11-27 DIAGNOSIS — Z5189 Encounter for other specified aftercare: Secondary | ICD-10-CM | POA: Diagnosis not present

## 2017-11-29 DIAGNOSIS — C4022 Malignant neoplasm of long bones of left lower limb: Secondary | ICD-10-CM | POA: Diagnosis not present

## 2017-11-30 DIAGNOSIS — M6281 Muscle weakness (generalized): Secondary | ICD-10-CM | POA: Diagnosis not present

## 2017-11-30 DIAGNOSIS — Z5189 Encounter for other specified aftercare: Secondary | ICD-10-CM | POA: Diagnosis not present

## 2017-11-30 DIAGNOSIS — R2681 Unsteadiness on feet: Secondary | ICD-10-CM | POA: Diagnosis not present

## 2017-11-30 DIAGNOSIS — M25552 Pain in left hip: Secondary | ICD-10-CM | POA: Diagnosis not present

## 2017-12-04 DIAGNOSIS — R2681 Unsteadiness on feet: Secondary | ICD-10-CM | POA: Diagnosis not present

## 2017-12-04 DIAGNOSIS — Z5189 Encounter for other specified aftercare: Secondary | ICD-10-CM | POA: Diagnosis not present

## 2017-12-04 DIAGNOSIS — M6281 Muscle weakness (generalized): Secondary | ICD-10-CM | POA: Diagnosis not present

## 2017-12-04 DIAGNOSIS — M25552 Pain in left hip: Secondary | ICD-10-CM | POA: Diagnosis not present

## 2017-12-06 DIAGNOSIS — Z5189 Encounter for other specified aftercare: Secondary | ICD-10-CM | POA: Diagnosis not present

## 2017-12-06 DIAGNOSIS — M6281 Muscle weakness (generalized): Secondary | ICD-10-CM | POA: Diagnosis not present

## 2017-12-06 DIAGNOSIS — M25552 Pain in left hip: Secondary | ICD-10-CM | POA: Diagnosis not present

## 2017-12-06 DIAGNOSIS — R2681 Unsteadiness on feet: Secondary | ICD-10-CM | POA: Diagnosis not present

## 2017-12-10 DIAGNOSIS — I959 Hypotension, unspecified: Secondary | ICD-10-CM | POA: Diagnosis not present

## 2017-12-11 DIAGNOSIS — R2681 Unsteadiness on feet: Secondary | ICD-10-CM | POA: Diagnosis not present

## 2017-12-11 DIAGNOSIS — Z5189 Encounter for other specified aftercare: Secondary | ICD-10-CM | POA: Diagnosis not present

## 2017-12-11 DIAGNOSIS — M25552 Pain in left hip: Secondary | ICD-10-CM | POA: Diagnosis not present

## 2017-12-11 DIAGNOSIS — M6281 Muscle weakness (generalized): Secondary | ICD-10-CM | POA: Diagnosis not present

## 2017-12-11 DIAGNOSIS — I959 Hypotension, unspecified: Secondary | ICD-10-CM | POA: Diagnosis not present

## 2017-12-13 DIAGNOSIS — M25552 Pain in left hip: Secondary | ICD-10-CM | POA: Diagnosis not present

## 2017-12-13 DIAGNOSIS — M545 Low back pain: Secondary | ICD-10-CM | POA: Diagnosis not present

## 2017-12-17 DIAGNOSIS — M545 Low back pain: Secondary | ICD-10-CM | POA: Diagnosis not present

## 2017-12-17 DIAGNOSIS — M25552 Pain in left hip: Secondary | ICD-10-CM | POA: Diagnosis not present

## 2017-12-19 DIAGNOSIS — I10 Essential (primary) hypertension: Secondary | ICD-10-CM | POA: Diagnosis not present

## 2017-12-19 DIAGNOSIS — D649 Anemia, unspecified: Secondary | ICD-10-CM | POA: Diagnosis not present

## 2017-12-19 DIAGNOSIS — Z9889 Other specified postprocedural states: Secondary | ICD-10-CM | POA: Diagnosis not present

## 2017-12-21 DIAGNOSIS — I10 Essential (primary) hypertension: Secondary | ICD-10-CM | POA: Diagnosis not present

## 2017-12-21 DIAGNOSIS — K219 Gastro-esophageal reflux disease without esophagitis: Secondary | ICD-10-CM | POA: Diagnosis not present

## 2017-12-21 DIAGNOSIS — C4022 Malignant neoplasm of long bones of left lower limb: Secondary | ICD-10-CM | POA: Diagnosis not present

## 2017-12-21 DIAGNOSIS — Z9889 Other specified postprocedural states: Secondary | ICD-10-CM | POA: Diagnosis not present

## 2017-12-27 DIAGNOSIS — C4022 Malignant neoplasm of long bones of left lower limb: Secondary | ICD-10-CM | POA: Diagnosis not present

## 2017-12-31 DIAGNOSIS — M25552 Pain in left hip: Secondary | ICD-10-CM | POA: Diagnosis not present

## 2017-12-31 DIAGNOSIS — M545 Low back pain: Secondary | ICD-10-CM | POA: Diagnosis not present

## 2018-01-02 DIAGNOSIS — R05 Cough: Secondary | ICD-10-CM | POA: Diagnosis not present

## 2018-01-02 DIAGNOSIS — I10 Essential (primary) hypertension: Secondary | ICD-10-CM | POA: Diagnosis not present

## 2018-01-03 DIAGNOSIS — I10 Essential (primary) hypertension: Secondary | ICD-10-CM | POA: Diagnosis not present

## 2018-01-03 DIAGNOSIS — R05 Cough: Secondary | ICD-10-CM | POA: Diagnosis not present

## 2018-01-04 DIAGNOSIS — K209 Esophagitis, unspecified: Secondary | ICD-10-CM | POA: Diagnosis not present

## 2018-01-07 DIAGNOSIS — K209 Esophagitis, unspecified: Secondary | ICD-10-CM | POA: Diagnosis not present

## 2018-01-08 DIAGNOSIS — K209 Esophagitis, unspecified: Secondary | ICD-10-CM | POA: Diagnosis not present

## 2018-01-09 DIAGNOSIS — M545 Low back pain: Secondary | ICD-10-CM | POA: Diagnosis not present

## 2018-01-09 DIAGNOSIS — M25552 Pain in left hip: Secondary | ICD-10-CM | POA: Diagnosis not present

## 2018-01-11 DIAGNOSIS — C4022 Malignant neoplasm of long bones of left lower limb: Secondary | ICD-10-CM | POA: Diagnosis not present

## 2018-01-11 DIAGNOSIS — I10 Essential (primary) hypertension: Secondary | ICD-10-CM | POA: Diagnosis not present

## 2018-01-11 DIAGNOSIS — Z96642 Presence of left artificial hip joint: Secondary | ICD-10-CM | POA: Diagnosis not present

## 2018-01-11 DIAGNOSIS — Z9889 Other specified postprocedural states: Secondary | ICD-10-CM | POA: Diagnosis not present

## 2018-01-14 DIAGNOSIS — R600 Localized edema: Secondary | ICD-10-CM | POA: Diagnosis not present

## 2018-01-14 DIAGNOSIS — D649 Anemia, unspecified: Secondary | ICD-10-CM | POA: Diagnosis not present

## 2018-01-14 DIAGNOSIS — M5020 Other cervical disc displacement, unspecified cervical region: Secondary | ICD-10-CM | POA: Diagnosis not present

## 2018-01-14 DIAGNOSIS — Z96642 Presence of left artificial hip joint: Secondary | ICD-10-CM | POA: Diagnosis not present

## 2018-01-14 DIAGNOSIS — D480 Neoplasm of uncertain behavior of bone and articular cartilage: Secondary | ICD-10-CM | POA: Diagnosis not present

## 2018-01-14 DIAGNOSIS — Z483 Aftercare following surgery for neoplasm: Secondary | ICD-10-CM | POA: Diagnosis not present

## 2018-01-14 DIAGNOSIS — I951 Orthostatic hypotension: Secondary | ICD-10-CM | POA: Diagnosis not present

## 2018-01-14 DIAGNOSIS — Z7982 Long term (current) use of aspirin: Secondary | ICD-10-CM | POA: Diagnosis not present

## 2018-01-14 DIAGNOSIS — I10 Essential (primary) hypertension: Secondary | ICD-10-CM | POA: Diagnosis not present

## 2018-01-15 DIAGNOSIS — Z96642 Presence of left artificial hip joint: Secondary | ICD-10-CM | POA: Diagnosis not present

## 2018-01-15 DIAGNOSIS — Z483 Aftercare following surgery for neoplasm: Secondary | ICD-10-CM | POA: Diagnosis not present

## 2018-01-15 DIAGNOSIS — M5020 Other cervical disc displacement, unspecified cervical region: Secondary | ICD-10-CM | POA: Diagnosis not present

## 2018-01-15 DIAGNOSIS — D649 Anemia, unspecified: Secondary | ICD-10-CM | POA: Diagnosis not present

## 2018-01-15 DIAGNOSIS — I951 Orthostatic hypotension: Secondary | ICD-10-CM | POA: Diagnosis not present

## 2018-01-15 DIAGNOSIS — D480 Neoplasm of uncertain behavior of bone and articular cartilage: Secondary | ICD-10-CM | POA: Diagnosis not present

## 2018-01-16 DIAGNOSIS — D649 Anemia, unspecified: Secondary | ICD-10-CM | POA: Diagnosis not present

## 2018-01-16 DIAGNOSIS — M5020 Other cervical disc displacement, unspecified cervical region: Secondary | ICD-10-CM | POA: Diagnosis not present

## 2018-01-16 DIAGNOSIS — Z483 Aftercare following surgery for neoplasm: Secondary | ICD-10-CM | POA: Diagnosis not present

## 2018-01-16 DIAGNOSIS — I951 Orthostatic hypotension: Secondary | ICD-10-CM | POA: Diagnosis not present

## 2018-01-16 DIAGNOSIS — Z96642 Presence of left artificial hip joint: Secondary | ICD-10-CM | POA: Diagnosis not present

## 2018-01-16 DIAGNOSIS — D480 Neoplasm of uncertain behavior of bone and articular cartilage: Secondary | ICD-10-CM | POA: Diagnosis not present

## 2018-01-17 DIAGNOSIS — M5020 Other cervical disc displacement, unspecified cervical region: Secondary | ICD-10-CM | POA: Diagnosis not present

## 2018-01-17 DIAGNOSIS — I951 Orthostatic hypotension: Secondary | ICD-10-CM | POA: Diagnosis not present

## 2018-01-17 DIAGNOSIS — D649 Anemia, unspecified: Secondary | ICD-10-CM | POA: Diagnosis not present

## 2018-01-17 DIAGNOSIS — D480 Neoplasm of uncertain behavior of bone and articular cartilage: Secondary | ICD-10-CM | POA: Diagnosis not present

## 2018-01-17 DIAGNOSIS — Z483 Aftercare following surgery for neoplasm: Secondary | ICD-10-CM | POA: Diagnosis not present

## 2018-01-17 DIAGNOSIS — Z96642 Presence of left artificial hip joint: Secondary | ICD-10-CM | POA: Diagnosis not present

## 2018-01-21 DIAGNOSIS — D649 Anemia, unspecified: Secondary | ICD-10-CM | POA: Diagnosis not present

## 2018-01-21 DIAGNOSIS — Z483 Aftercare following surgery for neoplasm: Secondary | ICD-10-CM | POA: Diagnosis not present

## 2018-01-21 DIAGNOSIS — D480 Neoplasm of uncertain behavior of bone and articular cartilage: Secondary | ICD-10-CM | POA: Diagnosis not present

## 2018-01-21 DIAGNOSIS — I951 Orthostatic hypotension: Secondary | ICD-10-CM | POA: Diagnosis not present

## 2018-01-21 DIAGNOSIS — Z96642 Presence of left artificial hip joint: Secondary | ICD-10-CM | POA: Diagnosis not present

## 2018-01-21 DIAGNOSIS — M5020 Other cervical disc displacement, unspecified cervical region: Secondary | ICD-10-CM | POA: Diagnosis not present

## 2018-01-22 DIAGNOSIS — I951 Orthostatic hypotension: Secondary | ICD-10-CM | POA: Diagnosis not present

## 2018-01-22 DIAGNOSIS — D649 Anemia, unspecified: Secondary | ICD-10-CM | POA: Diagnosis not present

## 2018-01-22 DIAGNOSIS — D480 Neoplasm of uncertain behavior of bone and articular cartilage: Secondary | ICD-10-CM | POA: Diagnosis not present

## 2018-01-22 DIAGNOSIS — Z483 Aftercare following surgery for neoplasm: Secondary | ICD-10-CM | POA: Diagnosis not present

## 2018-01-22 DIAGNOSIS — Z96642 Presence of left artificial hip joint: Secondary | ICD-10-CM | POA: Diagnosis not present

## 2018-01-22 DIAGNOSIS — M5020 Other cervical disc displacement, unspecified cervical region: Secondary | ICD-10-CM | POA: Diagnosis not present

## 2018-01-23 DIAGNOSIS — K4090 Unilateral inguinal hernia, without obstruction or gangrene, not specified as recurrent: Secondary | ICD-10-CM | POA: Diagnosis not present

## 2018-01-23 DIAGNOSIS — I10 Essential (primary) hypertension: Secondary | ICD-10-CM | POA: Diagnosis not present

## 2018-01-23 DIAGNOSIS — C4022 Malignant neoplasm of long bones of left lower limb: Secondary | ICD-10-CM | POA: Diagnosis not present

## 2018-01-24 DIAGNOSIS — I951 Orthostatic hypotension: Secondary | ICD-10-CM | POA: Diagnosis not present

## 2018-01-24 DIAGNOSIS — D649 Anemia, unspecified: Secondary | ICD-10-CM | POA: Diagnosis not present

## 2018-01-24 DIAGNOSIS — Z96642 Presence of left artificial hip joint: Secondary | ICD-10-CM | POA: Diagnosis not present

## 2018-01-24 DIAGNOSIS — Z483 Aftercare following surgery for neoplasm: Secondary | ICD-10-CM | POA: Diagnosis not present

## 2018-01-24 DIAGNOSIS — D480 Neoplasm of uncertain behavior of bone and articular cartilage: Secondary | ICD-10-CM | POA: Diagnosis not present

## 2018-01-24 DIAGNOSIS — M5020 Other cervical disc displacement, unspecified cervical region: Secondary | ICD-10-CM | POA: Diagnosis not present

## 2018-01-26 DIAGNOSIS — Z483 Aftercare following surgery for neoplasm: Secondary | ICD-10-CM | POA: Diagnosis not present

## 2018-01-26 DIAGNOSIS — D649 Anemia, unspecified: Secondary | ICD-10-CM | POA: Diagnosis not present

## 2018-01-26 DIAGNOSIS — D480 Neoplasm of uncertain behavior of bone and articular cartilage: Secondary | ICD-10-CM | POA: Diagnosis not present

## 2018-01-26 DIAGNOSIS — M5020 Other cervical disc displacement, unspecified cervical region: Secondary | ICD-10-CM | POA: Diagnosis not present

## 2018-01-26 DIAGNOSIS — I951 Orthostatic hypotension: Secondary | ICD-10-CM | POA: Diagnosis not present

## 2018-01-26 DIAGNOSIS — Z96642 Presence of left artificial hip joint: Secondary | ICD-10-CM | POA: Diagnosis not present

## 2018-01-29 DIAGNOSIS — D649 Anemia, unspecified: Secondary | ICD-10-CM | POA: Diagnosis not present

## 2018-01-29 DIAGNOSIS — M5020 Other cervical disc displacement, unspecified cervical region: Secondary | ICD-10-CM | POA: Diagnosis not present

## 2018-01-29 DIAGNOSIS — Z96642 Presence of left artificial hip joint: Secondary | ICD-10-CM | POA: Diagnosis not present

## 2018-01-29 DIAGNOSIS — Z483 Aftercare following surgery for neoplasm: Secondary | ICD-10-CM | POA: Diagnosis not present

## 2018-01-29 DIAGNOSIS — D480 Neoplasm of uncertain behavior of bone and articular cartilage: Secondary | ICD-10-CM | POA: Diagnosis not present

## 2018-01-29 DIAGNOSIS — I951 Orthostatic hypotension: Secondary | ICD-10-CM | POA: Diagnosis not present

## 2018-01-31 DIAGNOSIS — D480 Neoplasm of uncertain behavior of bone and articular cartilage: Secondary | ICD-10-CM | POA: Diagnosis not present

## 2018-01-31 DIAGNOSIS — Z483 Aftercare following surgery for neoplasm: Secondary | ICD-10-CM | POA: Diagnosis not present

## 2018-01-31 DIAGNOSIS — D649 Anemia, unspecified: Secondary | ICD-10-CM | POA: Diagnosis not present

## 2018-01-31 DIAGNOSIS — I951 Orthostatic hypotension: Secondary | ICD-10-CM | POA: Diagnosis not present

## 2018-01-31 DIAGNOSIS — Z96642 Presence of left artificial hip joint: Secondary | ICD-10-CM | POA: Diagnosis not present

## 2018-01-31 DIAGNOSIS — M5020 Other cervical disc displacement, unspecified cervical region: Secondary | ICD-10-CM | POA: Diagnosis not present

## 2018-02-01 DIAGNOSIS — Z483 Aftercare following surgery for neoplasm: Secondary | ICD-10-CM | POA: Diagnosis not present

## 2018-02-01 DIAGNOSIS — I951 Orthostatic hypotension: Secondary | ICD-10-CM | POA: Diagnosis not present

## 2018-02-01 DIAGNOSIS — D649 Anemia, unspecified: Secondary | ICD-10-CM | POA: Diagnosis not present

## 2018-02-01 DIAGNOSIS — M5020 Other cervical disc displacement, unspecified cervical region: Secondary | ICD-10-CM | POA: Diagnosis not present

## 2018-02-01 DIAGNOSIS — Z96642 Presence of left artificial hip joint: Secondary | ICD-10-CM | POA: Diagnosis not present

## 2018-02-01 DIAGNOSIS — D480 Neoplasm of uncertain behavior of bone and articular cartilage: Secondary | ICD-10-CM | POA: Diagnosis not present

## 2018-02-04 DIAGNOSIS — D480 Neoplasm of uncertain behavior of bone and articular cartilage: Secondary | ICD-10-CM | POA: Diagnosis not present

## 2018-02-04 DIAGNOSIS — Z483 Aftercare following surgery for neoplasm: Secondary | ICD-10-CM | POA: Diagnosis not present

## 2018-02-04 DIAGNOSIS — M5020 Other cervical disc displacement, unspecified cervical region: Secondary | ICD-10-CM | POA: Diagnosis not present

## 2018-02-04 DIAGNOSIS — I951 Orthostatic hypotension: Secondary | ICD-10-CM | POA: Diagnosis not present

## 2018-02-04 DIAGNOSIS — Z96642 Presence of left artificial hip joint: Secondary | ICD-10-CM | POA: Diagnosis not present

## 2018-02-04 DIAGNOSIS — D649 Anemia, unspecified: Secondary | ICD-10-CM | POA: Diagnosis not present

## 2018-02-05 DIAGNOSIS — Z96642 Presence of left artificial hip joint: Secondary | ICD-10-CM | POA: Diagnosis not present

## 2018-02-05 DIAGNOSIS — Z483 Aftercare following surgery for neoplasm: Secondary | ICD-10-CM | POA: Diagnosis not present

## 2018-02-05 DIAGNOSIS — M5020 Other cervical disc displacement, unspecified cervical region: Secondary | ICD-10-CM | POA: Diagnosis not present

## 2018-02-05 DIAGNOSIS — I951 Orthostatic hypotension: Secondary | ICD-10-CM | POA: Diagnosis not present

## 2018-02-05 DIAGNOSIS — D480 Neoplasm of uncertain behavior of bone and articular cartilage: Secondary | ICD-10-CM | POA: Diagnosis not present

## 2018-02-05 DIAGNOSIS — D649 Anemia, unspecified: Secondary | ICD-10-CM | POA: Diagnosis not present

## 2018-02-06 DIAGNOSIS — M5020 Other cervical disc displacement, unspecified cervical region: Secondary | ICD-10-CM | POA: Diagnosis not present

## 2018-02-06 DIAGNOSIS — Z483 Aftercare following surgery for neoplasm: Secondary | ICD-10-CM | POA: Diagnosis not present

## 2018-02-06 DIAGNOSIS — D480 Neoplasm of uncertain behavior of bone and articular cartilage: Secondary | ICD-10-CM | POA: Diagnosis not present

## 2018-02-06 DIAGNOSIS — I951 Orthostatic hypotension: Secondary | ICD-10-CM | POA: Diagnosis not present

## 2018-02-06 DIAGNOSIS — Z96642 Presence of left artificial hip joint: Secondary | ICD-10-CM | POA: Diagnosis not present

## 2018-02-06 DIAGNOSIS — D649 Anemia, unspecified: Secondary | ICD-10-CM | POA: Diagnosis not present

## 2018-02-07 DIAGNOSIS — M25552 Pain in left hip: Secondary | ICD-10-CM | POA: Diagnosis not present

## 2018-02-07 DIAGNOSIS — Z79899 Other long term (current) drug therapy: Secondary | ICD-10-CM | POA: Diagnosis not present

## 2018-02-07 DIAGNOSIS — Z96642 Presence of left artificial hip joint: Secondary | ICD-10-CM | POA: Diagnosis not present

## 2018-02-07 DIAGNOSIS — C4022 Malignant neoplasm of long bones of left lower limb: Secondary | ICD-10-CM | POA: Diagnosis not present

## 2018-02-07 DIAGNOSIS — E049 Nontoxic goiter, unspecified: Secondary | ICD-10-CM | POA: Diagnosis not present

## 2018-02-07 DIAGNOSIS — Z9889 Other specified postprocedural states: Secondary | ICD-10-CM | POA: Diagnosis not present

## 2018-02-07 DIAGNOSIS — K4090 Unilateral inguinal hernia, without obstruction or gangrene, not specified as recurrent: Secondary | ICD-10-CM | POA: Diagnosis not present

## 2018-02-07 DIAGNOSIS — Z7982 Long term (current) use of aspirin: Secondary | ICD-10-CM | POA: Diagnosis not present

## 2018-02-07 DIAGNOSIS — M545 Low back pain: Secondary | ICD-10-CM | POA: Diagnosis not present

## 2018-02-08 DIAGNOSIS — D480 Neoplasm of uncertain behavior of bone and articular cartilage: Secondary | ICD-10-CM | POA: Diagnosis not present

## 2018-02-08 DIAGNOSIS — M5020 Other cervical disc displacement, unspecified cervical region: Secondary | ICD-10-CM | POA: Diagnosis not present

## 2018-02-08 DIAGNOSIS — D649 Anemia, unspecified: Secondary | ICD-10-CM | POA: Diagnosis not present

## 2018-02-08 DIAGNOSIS — Z483 Aftercare following surgery for neoplasm: Secondary | ICD-10-CM | POA: Diagnosis not present

## 2018-02-08 DIAGNOSIS — I951 Orthostatic hypotension: Secondary | ICD-10-CM | POA: Diagnosis not present

## 2018-02-08 DIAGNOSIS — Z96642 Presence of left artificial hip joint: Secondary | ICD-10-CM | POA: Diagnosis not present

## 2018-02-13 DIAGNOSIS — Z483 Aftercare following surgery for neoplasm: Secondary | ICD-10-CM | POA: Diagnosis not present

## 2018-02-13 DIAGNOSIS — I951 Orthostatic hypotension: Secondary | ICD-10-CM | POA: Diagnosis not present

## 2018-02-13 DIAGNOSIS — D480 Neoplasm of uncertain behavior of bone and articular cartilage: Secondary | ICD-10-CM | POA: Diagnosis not present

## 2018-02-13 DIAGNOSIS — M5020 Other cervical disc displacement, unspecified cervical region: Secondary | ICD-10-CM | POA: Diagnosis not present

## 2018-02-13 DIAGNOSIS — D649 Anemia, unspecified: Secondary | ICD-10-CM | POA: Diagnosis not present

## 2018-02-13 DIAGNOSIS — Z96642 Presence of left artificial hip joint: Secondary | ICD-10-CM | POA: Diagnosis not present

## 2018-02-15 DIAGNOSIS — Z96642 Presence of left artificial hip joint: Secondary | ICD-10-CM | POA: Diagnosis not present

## 2018-02-15 DIAGNOSIS — D480 Neoplasm of uncertain behavior of bone and articular cartilage: Secondary | ICD-10-CM | POA: Diagnosis not present

## 2018-02-15 DIAGNOSIS — D649 Anemia, unspecified: Secondary | ICD-10-CM | POA: Diagnosis not present

## 2018-02-15 DIAGNOSIS — M5020 Other cervical disc displacement, unspecified cervical region: Secondary | ICD-10-CM | POA: Diagnosis not present

## 2018-02-15 DIAGNOSIS — Z483 Aftercare following surgery for neoplasm: Secondary | ICD-10-CM | POA: Diagnosis not present

## 2018-02-15 DIAGNOSIS — I951 Orthostatic hypotension: Secondary | ICD-10-CM | POA: Diagnosis not present

## 2018-02-19 DIAGNOSIS — M5020 Other cervical disc displacement, unspecified cervical region: Secondary | ICD-10-CM | POA: Diagnosis not present

## 2018-02-19 DIAGNOSIS — D480 Neoplasm of uncertain behavior of bone and articular cartilage: Secondary | ICD-10-CM | POA: Diagnosis not present

## 2018-02-19 DIAGNOSIS — Z96642 Presence of left artificial hip joint: Secondary | ICD-10-CM | POA: Diagnosis not present

## 2018-02-19 DIAGNOSIS — D649 Anemia, unspecified: Secondary | ICD-10-CM | POA: Diagnosis not present

## 2018-02-19 DIAGNOSIS — I951 Orthostatic hypotension: Secondary | ICD-10-CM | POA: Diagnosis not present

## 2018-02-19 DIAGNOSIS — Z483 Aftercare following surgery for neoplasm: Secondary | ICD-10-CM | POA: Diagnosis not present

## 2018-02-20 DIAGNOSIS — I951 Orthostatic hypotension: Secondary | ICD-10-CM | POA: Diagnosis not present

## 2018-02-20 DIAGNOSIS — Z483 Aftercare following surgery for neoplasm: Secondary | ICD-10-CM | POA: Diagnosis not present

## 2018-02-20 DIAGNOSIS — M5020 Other cervical disc displacement, unspecified cervical region: Secondary | ICD-10-CM | POA: Diagnosis not present

## 2018-02-20 DIAGNOSIS — D649 Anemia, unspecified: Secondary | ICD-10-CM | POA: Diagnosis not present

## 2018-02-20 DIAGNOSIS — Z96642 Presence of left artificial hip joint: Secondary | ICD-10-CM | POA: Diagnosis not present

## 2018-02-20 DIAGNOSIS — D480 Neoplasm of uncertain behavior of bone and articular cartilage: Secondary | ICD-10-CM | POA: Diagnosis not present

## 2018-02-27 DIAGNOSIS — D480 Neoplasm of uncertain behavior of bone and articular cartilage: Secondary | ICD-10-CM | POA: Diagnosis not present

## 2018-02-27 DIAGNOSIS — Z96642 Presence of left artificial hip joint: Secondary | ICD-10-CM | POA: Diagnosis not present

## 2018-02-27 DIAGNOSIS — M5020 Other cervical disc displacement, unspecified cervical region: Secondary | ICD-10-CM | POA: Diagnosis not present

## 2018-02-27 DIAGNOSIS — I951 Orthostatic hypotension: Secondary | ICD-10-CM | POA: Diagnosis not present

## 2018-02-27 DIAGNOSIS — D649 Anemia, unspecified: Secondary | ICD-10-CM | POA: Diagnosis not present

## 2018-02-27 DIAGNOSIS — Z483 Aftercare following surgery for neoplasm: Secondary | ICD-10-CM | POA: Diagnosis not present

## 2018-03-04 DIAGNOSIS — Z6824 Body mass index (BMI) 24.0-24.9, adult: Secondary | ICD-10-CM | POA: Diagnosis not present

## 2018-03-04 DIAGNOSIS — J309 Allergic rhinitis, unspecified: Secondary | ICD-10-CM | POA: Diagnosis not present

## 2018-03-04 DIAGNOSIS — M25551 Pain in right hip: Secondary | ICD-10-CM | POA: Diagnosis not present

## 2018-03-04 DIAGNOSIS — M47812 Spondylosis without myelopathy or radiculopathy, cervical region: Secondary | ICD-10-CM | POA: Diagnosis not present

## 2018-03-04 DIAGNOSIS — M545 Low back pain: Secondary | ICD-10-CM | POA: Diagnosis not present

## 2018-03-04 DIAGNOSIS — K219 Gastro-esophageal reflux disease without esophagitis: Secondary | ICD-10-CM | POA: Diagnosis not present

## 2018-03-04 DIAGNOSIS — C4022 Malignant neoplasm of long bones of left lower limb: Secondary | ICD-10-CM | POA: Diagnosis not present

## 2018-03-04 DIAGNOSIS — I10 Essential (primary) hypertension: Secondary | ICD-10-CM | POA: Diagnosis not present

## 2018-03-04 DIAGNOSIS — R1013 Epigastric pain: Secondary | ICD-10-CM | POA: Diagnosis not present

## 2018-03-12 DIAGNOSIS — M25551 Pain in right hip: Secondary | ICD-10-CM | POA: Diagnosis not present

## 2018-03-19 DIAGNOSIS — Z96642 Presence of left artificial hip joint: Secondary | ICD-10-CM | POA: Diagnosis not present

## 2018-03-19 DIAGNOSIS — R269 Unspecified abnormalities of gait and mobility: Secondary | ICD-10-CM | POA: Diagnosis not present

## 2018-03-19 DIAGNOSIS — R279 Unspecified lack of coordination: Secondary | ICD-10-CM | POA: Diagnosis not present

## 2018-03-19 DIAGNOSIS — M25551 Pain in right hip: Secondary | ICD-10-CM | POA: Diagnosis not present

## 2018-03-20 ENCOUNTER — Encounter: Payer: Self-pay | Admitting: Family Medicine

## 2018-03-25 DIAGNOSIS — Z96642 Presence of left artificial hip joint: Secondary | ICD-10-CM | POA: Diagnosis not present

## 2018-03-25 DIAGNOSIS — R279 Unspecified lack of coordination: Secondary | ICD-10-CM | POA: Diagnosis not present

## 2018-03-25 DIAGNOSIS — M25551 Pain in right hip: Secondary | ICD-10-CM | POA: Diagnosis not present

## 2018-03-25 DIAGNOSIS — R269 Unspecified abnormalities of gait and mobility: Secondary | ICD-10-CM | POA: Diagnosis not present

## 2018-03-27 DIAGNOSIS — R279 Unspecified lack of coordination: Secondary | ICD-10-CM | POA: Diagnosis not present

## 2018-03-27 DIAGNOSIS — Z96642 Presence of left artificial hip joint: Secondary | ICD-10-CM | POA: Diagnosis not present

## 2018-03-27 DIAGNOSIS — R269 Unspecified abnormalities of gait and mobility: Secondary | ICD-10-CM | POA: Diagnosis not present

## 2018-03-27 DIAGNOSIS — M25551 Pain in right hip: Secondary | ICD-10-CM | POA: Diagnosis not present

## 2018-04-02 DIAGNOSIS — M25551 Pain in right hip: Secondary | ICD-10-CM | POA: Diagnosis not present

## 2018-04-02 DIAGNOSIS — Z96642 Presence of left artificial hip joint: Secondary | ICD-10-CM | POA: Diagnosis not present

## 2018-04-02 DIAGNOSIS — R279 Unspecified lack of coordination: Secondary | ICD-10-CM | POA: Diagnosis not present

## 2018-04-02 DIAGNOSIS — R269 Unspecified abnormalities of gait and mobility: Secondary | ICD-10-CM | POA: Diagnosis not present

## 2018-04-04 DIAGNOSIS — R279 Unspecified lack of coordination: Secondary | ICD-10-CM | POA: Diagnosis not present

## 2018-04-04 DIAGNOSIS — M25551 Pain in right hip: Secondary | ICD-10-CM | POA: Diagnosis not present

## 2018-04-04 DIAGNOSIS — Z96642 Presence of left artificial hip joint: Secondary | ICD-10-CM | POA: Diagnosis not present

## 2018-04-04 DIAGNOSIS — R269 Unspecified abnormalities of gait and mobility: Secondary | ICD-10-CM | POA: Diagnosis not present

## 2018-04-08 DIAGNOSIS — M25551 Pain in right hip: Secondary | ICD-10-CM | POA: Diagnosis not present

## 2018-04-08 DIAGNOSIS — Z96642 Presence of left artificial hip joint: Secondary | ICD-10-CM | POA: Diagnosis not present

## 2018-04-08 DIAGNOSIS — R279 Unspecified lack of coordination: Secondary | ICD-10-CM | POA: Diagnosis not present

## 2018-04-08 DIAGNOSIS — R269 Unspecified abnormalities of gait and mobility: Secondary | ICD-10-CM | POA: Diagnosis not present

## 2018-04-11 DIAGNOSIS — M25551 Pain in right hip: Secondary | ICD-10-CM | POA: Diagnosis not present

## 2018-04-11 DIAGNOSIS — R279 Unspecified lack of coordination: Secondary | ICD-10-CM | POA: Diagnosis not present

## 2018-04-11 DIAGNOSIS — Z96642 Presence of left artificial hip joint: Secondary | ICD-10-CM | POA: Diagnosis not present

## 2018-04-11 DIAGNOSIS — R269 Unspecified abnormalities of gait and mobility: Secondary | ICD-10-CM | POA: Diagnosis not present

## 2018-04-16 DIAGNOSIS — R279 Unspecified lack of coordination: Secondary | ICD-10-CM | POA: Diagnosis not present

## 2018-04-16 DIAGNOSIS — Z96642 Presence of left artificial hip joint: Secondary | ICD-10-CM | POA: Diagnosis not present

## 2018-04-16 DIAGNOSIS — M25551 Pain in right hip: Secondary | ICD-10-CM | POA: Diagnosis not present

## 2018-04-16 DIAGNOSIS — R269 Unspecified abnormalities of gait and mobility: Secondary | ICD-10-CM | POA: Diagnosis not present

## 2018-04-18 DIAGNOSIS — R269 Unspecified abnormalities of gait and mobility: Secondary | ICD-10-CM | POA: Diagnosis not present

## 2018-04-18 DIAGNOSIS — R279 Unspecified lack of coordination: Secondary | ICD-10-CM | POA: Diagnosis not present

## 2018-04-18 DIAGNOSIS — M25551 Pain in right hip: Secondary | ICD-10-CM | POA: Diagnosis not present

## 2018-04-18 DIAGNOSIS — Z96642 Presence of left artificial hip joint: Secondary | ICD-10-CM | POA: Diagnosis not present

## 2018-04-23 DIAGNOSIS — M25551 Pain in right hip: Secondary | ICD-10-CM | POA: Diagnosis not present

## 2018-04-23 DIAGNOSIS — R269 Unspecified abnormalities of gait and mobility: Secondary | ICD-10-CM | POA: Diagnosis not present

## 2018-04-23 DIAGNOSIS — Z96642 Presence of left artificial hip joint: Secondary | ICD-10-CM | POA: Diagnosis not present

## 2018-04-23 DIAGNOSIS — R279 Unspecified lack of coordination: Secondary | ICD-10-CM | POA: Diagnosis not present

## 2018-04-30 DIAGNOSIS — R269 Unspecified abnormalities of gait and mobility: Secondary | ICD-10-CM | POA: Diagnosis not present

## 2018-04-30 DIAGNOSIS — M25551 Pain in right hip: Secondary | ICD-10-CM | POA: Diagnosis not present

## 2018-04-30 DIAGNOSIS — Z96642 Presence of left artificial hip joint: Secondary | ICD-10-CM | POA: Diagnosis not present

## 2018-04-30 DIAGNOSIS — R279 Unspecified lack of coordination: Secondary | ICD-10-CM | POA: Diagnosis not present

## 2018-05-02 DIAGNOSIS — M25551 Pain in right hip: Secondary | ICD-10-CM | POA: Diagnosis not present

## 2018-05-02 DIAGNOSIS — Z96642 Presence of left artificial hip joint: Secondary | ICD-10-CM | POA: Diagnosis not present

## 2018-05-02 DIAGNOSIS — R279 Unspecified lack of coordination: Secondary | ICD-10-CM | POA: Diagnosis not present

## 2018-05-02 DIAGNOSIS — R269 Unspecified abnormalities of gait and mobility: Secondary | ICD-10-CM | POA: Diagnosis not present

## 2018-05-07 DIAGNOSIS — R269 Unspecified abnormalities of gait and mobility: Secondary | ICD-10-CM | POA: Diagnosis not present

## 2018-05-07 DIAGNOSIS — R279 Unspecified lack of coordination: Secondary | ICD-10-CM | POA: Diagnosis not present

## 2018-05-07 DIAGNOSIS — M25551 Pain in right hip: Secondary | ICD-10-CM | POA: Diagnosis not present

## 2018-05-07 DIAGNOSIS — Z96642 Presence of left artificial hip joint: Secondary | ICD-10-CM | POA: Diagnosis not present

## 2018-05-09 DIAGNOSIS — J432 Centrilobular emphysema: Secondary | ICD-10-CM | POA: Diagnosis not present

## 2018-05-09 DIAGNOSIS — Z79899 Other long term (current) drug therapy: Secondary | ICD-10-CM | POA: Diagnosis not present

## 2018-05-09 DIAGNOSIS — C4022 Malignant neoplasm of long bones of left lower limb: Secondary | ICD-10-CM | POA: Diagnosis not present

## 2018-05-09 DIAGNOSIS — E049 Nontoxic goiter, unspecified: Secondary | ICD-10-CM | POA: Diagnosis not present

## 2018-05-09 DIAGNOSIS — E041 Nontoxic single thyroid nodule: Secondary | ICD-10-CM | POA: Diagnosis not present

## 2018-05-09 DIAGNOSIS — Z7982 Long term (current) use of aspirin: Secondary | ICD-10-CM | POA: Diagnosis not present

## 2018-05-09 DIAGNOSIS — R918 Other nonspecific abnormal finding of lung field: Secondary | ICD-10-CM | POA: Diagnosis not present

## 2018-05-09 DIAGNOSIS — Z8249 Family history of ischemic heart disease and other diseases of the circulatory system: Secondary | ICD-10-CM | POA: Diagnosis not present

## 2018-05-10 DIAGNOSIS — R279 Unspecified lack of coordination: Secondary | ICD-10-CM | POA: Diagnosis not present

## 2018-05-10 DIAGNOSIS — R269 Unspecified abnormalities of gait and mobility: Secondary | ICD-10-CM | POA: Diagnosis not present

## 2018-05-10 DIAGNOSIS — Z96642 Presence of left artificial hip joint: Secondary | ICD-10-CM | POA: Diagnosis not present

## 2018-05-10 DIAGNOSIS — M25551 Pain in right hip: Secondary | ICD-10-CM | POA: Diagnosis not present

## 2018-05-14 DIAGNOSIS — R279 Unspecified lack of coordination: Secondary | ICD-10-CM | POA: Diagnosis not present

## 2018-05-14 DIAGNOSIS — M25551 Pain in right hip: Secondary | ICD-10-CM | POA: Diagnosis not present

## 2018-05-14 DIAGNOSIS — R269 Unspecified abnormalities of gait and mobility: Secondary | ICD-10-CM | POA: Diagnosis not present

## 2018-05-14 DIAGNOSIS — Z96642 Presence of left artificial hip joint: Secondary | ICD-10-CM | POA: Diagnosis not present

## 2018-05-27 DIAGNOSIS — R269 Unspecified abnormalities of gait and mobility: Secondary | ICD-10-CM | POA: Diagnosis not present

## 2018-05-27 DIAGNOSIS — Z96642 Presence of left artificial hip joint: Secondary | ICD-10-CM | POA: Diagnosis not present

## 2018-05-27 DIAGNOSIS — M25551 Pain in right hip: Secondary | ICD-10-CM | POA: Diagnosis not present

## 2018-05-27 DIAGNOSIS — R279 Unspecified lack of coordination: Secondary | ICD-10-CM | POA: Diagnosis not present

## 2018-05-29 DIAGNOSIS — R279 Unspecified lack of coordination: Secondary | ICD-10-CM | POA: Diagnosis not present

## 2018-05-29 DIAGNOSIS — R269 Unspecified abnormalities of gait and mobility: Secondary | ICD-10-CM | POA: Diagnosis not present

## 2018-05-29 DIAGNOSIS — Z96642 Presence of left artificial hip joint: Secondary | ICD-10-CM | POA: Diagnosis not present

## 2018-05-29 DIAGNOSIS — M25551 Pain in right hip: Secondary | ICD-10-CM | POA: Diagnosis not present

## 2018-08-15 DIAGNOSIS — Z79899 Other long term (current) drug therapy: Secondary | ICD-10-CM | POA: Diagnosis not present

## 2018-08-15 DIAGNOSIS — Z471 Aftercare following joint replacement surgery: Secondary | ICD-10-CM | POA: Diagnosis not present

## 2018-08-15 DIAGNOSIS — M79652 Pain in left thigh: Secondary | ICD-10-CM | POA: Diagnosis not present

## 2018-08-15 DIAGNOSIS — E042 Nontoxic multinodular goiter: Secondary | ICD-10-CM | POA: Diagnosis not present

## 2018-08-15 DIAGNOSIS — C4022 Malignant neoplasm of long bones of left lower limb: Secondary | ICD-10-CM | POA: Diagnosis not present

## 2018-08-15 DIAGNOSIS — E049 Nontoxic goiter, unspecified: Secondary | ICD-10-CM | POA: Diagnosis not present

## 2018-08-15 DIAGNOSIS — Z7982 Long term (current) use of aspirin: Secondary | ICD-10-CM | POA: Diagnosis not present

## 2018-08-15 DIAGNOSIS — Z96642 Presence of left artificial hip joint: Secondary | ICD-10-CM | POA: Diagnosis not present

## 2018-08-15 DIAGNOSIS — J398 Other specified diseases of upper respiratory tract: Secondary | ICD-10-CM | POA: Diagnosis not present

## 2018-08-15 DIAGNOSIS — D480 Neoplasm of uncertain behavior of bone and articular cartilage: Secondary | ICD-10-CM | POA: Diagnosis not present

## 2018-08-15 DIAGNOSIS — Z8249 Family history of ischemic heart disease and other diseases of the circulatory system: Secondary | ICD-10-CM | POA: Diagnosis not present

## 2018-08-15 DIAGNOSIS — M25552 Pain in left hip: Secondary | ICD-10-CM | POA: Diagnosis not present

## 2018-08-15 DIAGNOSIS — M25551 Pain in right hip: Secondary | ICD-10-CM | POA: Diagnosis not present

## 2018-08-26 DIAGNOSIS — J309 Allergic rhinitis, unspecified: Secondary | ICD-10-CM | POA: Diagnosis not present

## 2018-08-26 DIAGNOSIS — K219 Gastro-esophageal reflux disease without esophagitis: Secondary | ICD-10-CM | POA: Diagnosis not present

## 2018-08-26 DIAGNOSIS — I10 Essential (primary) hypertension: Secondary | ICD-10-CM | POA: Diagnosis not present

## 2018-08-26 DIAGNOSIS — Z6825 Body mass index (BMI) 25.0-25.9, adult: Secondary | ICD-10-CM | POA: Diagnosis not present

## 2018-08-26 DIAGNOSIS — Z23 Encounter for immunization: Secondary | ICD-10-CM | POA: Diagnosis not present

## 2018-08-26 DIAGNOSIS — Z0000 Encounter for general adult medical examination without abnormal findings: Secondary | ICD-10-CM | POA: Diagnosis not present

## 2018-08-26 DIAGNOSIS — M545 Low back pain: Secondary | ICD-10-CM | POA: Diagnosis not present

## 2018-08-26 DIAGNOSIS — C4022 Malignant neoplasm of long bones of left lower limb: Secondary | ICD-10-CM | POA: Diagnosis not present

## 2018-08-26 DIAGNOSIS — M47812 Spondylosis without myelopathy or radiculopathy, cervical region: Secondary | ICD-10-CM | POA: Diagnosis not present

## 2018-08-26 DIAGNOSIS — R1013 Epigastric pain: Secondary | ICD-10-CM | POA: Diagnosis not present

## 2018-09-24 DIAGNOSIS — H40023 Open angle with borderline findings, high risk, bilateral: Secondary | ICD-10-CM | POA: Diagnosis not present

## 2018-10-27 IMAGING — CT CT ABD-PELV W/ CM
3 of 5 series · 12 of 36 positions shown, 18 images · IV contrast (READICAT/WATER & [ID] ISOVUE 300)
Comparison: None.

CLINICAL DATA: Six month history of periumbilical pain.

EXAM:
CT ABDOMEN AND PELVIS WITH CONTRAST
TECHNIQUE: Multidetector CT imaging of the abdomen and pelvis was performed
using the standard protocol following bolus administration of
intravenous contrast.
CONTRAST:  100mL 2FDANP-QAA IOPAMIDOL (2FDANP-QAA) INJECTION 61%

[Series 3: abd/pelvis with · axial · 0.70mm/px · z∈[-344,-44]mm · 7 of 80 slices shown, 12 images]
[im 10/80  soft-tissue]
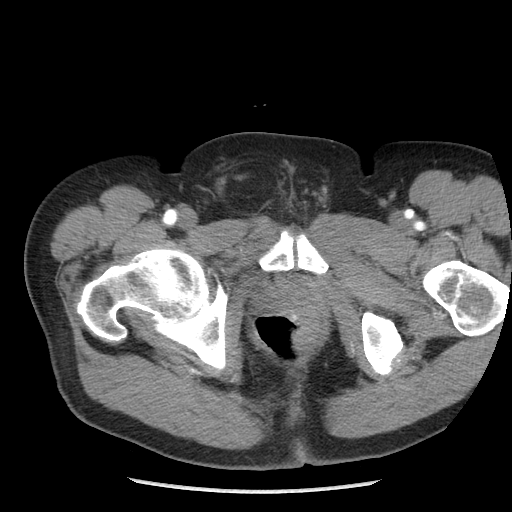
[im 10/80  bone]
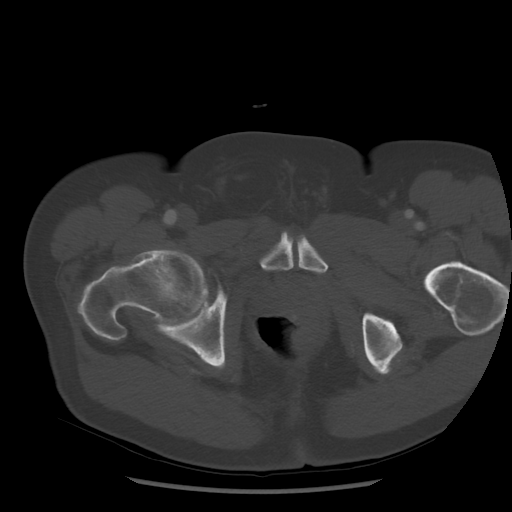
[im 20/80  soft-tissue]
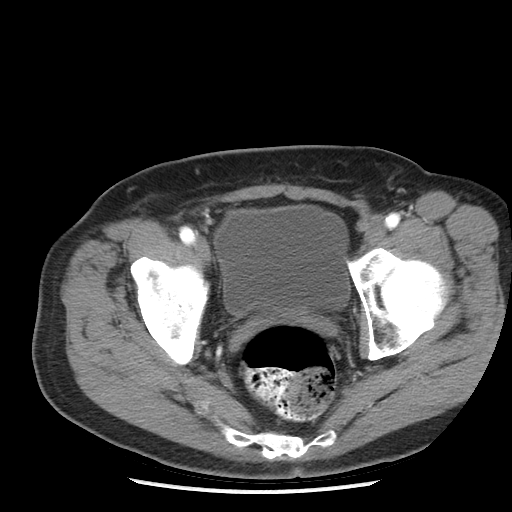
[im 30/80  soft-tissue]
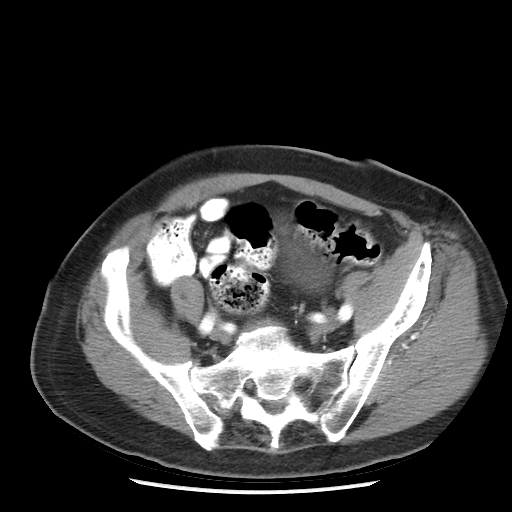
[im 40/80  soft-tissue]
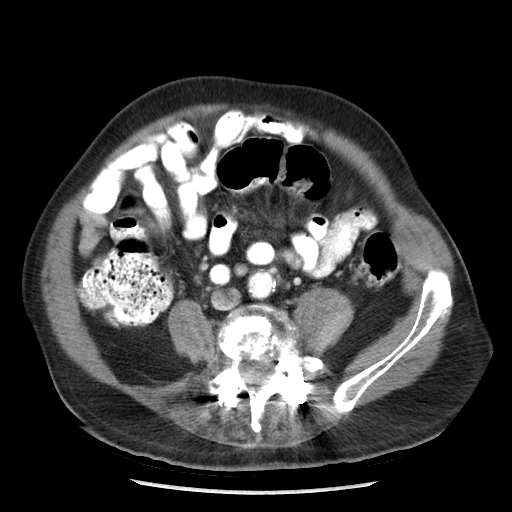
[im 40/80  lung]
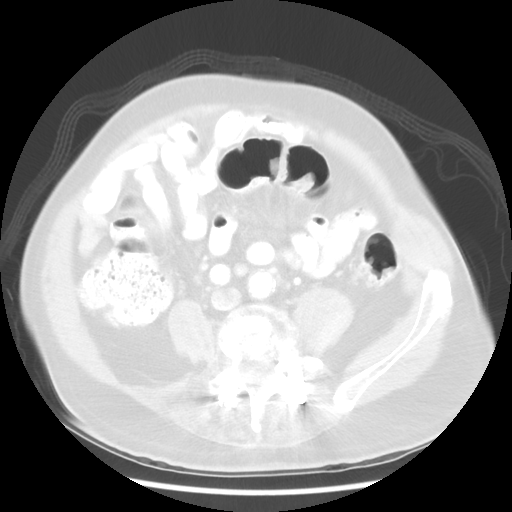
[im 50/80  soft-tissue]
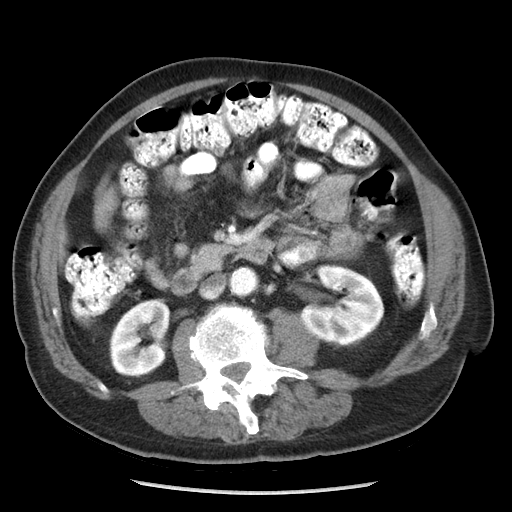
[im 50/80  lung]
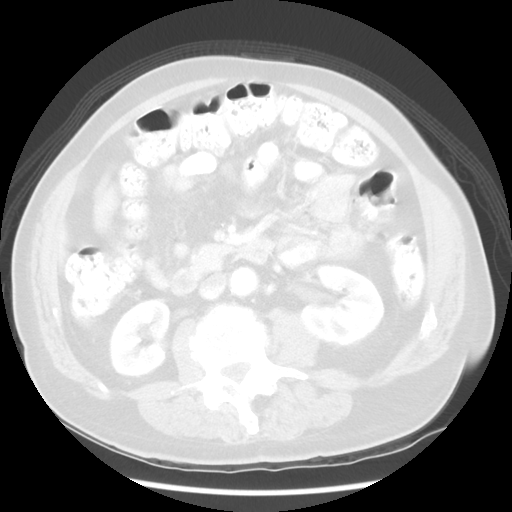
[im 60/80  soft-tissue]
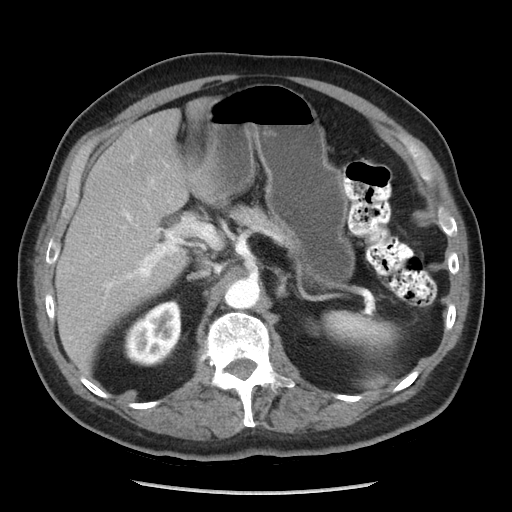
[im 60/80  lung]
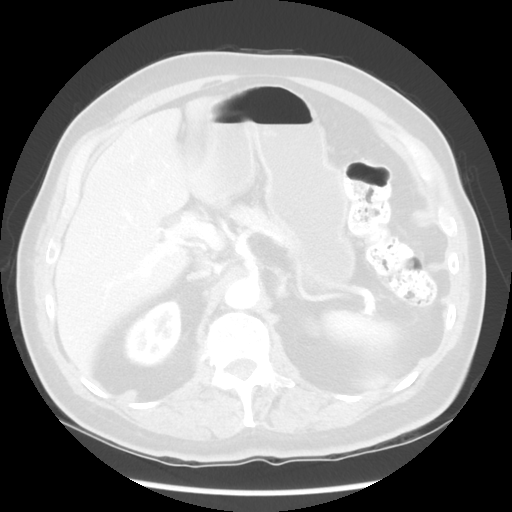
[im 70/80  soft-tissue]
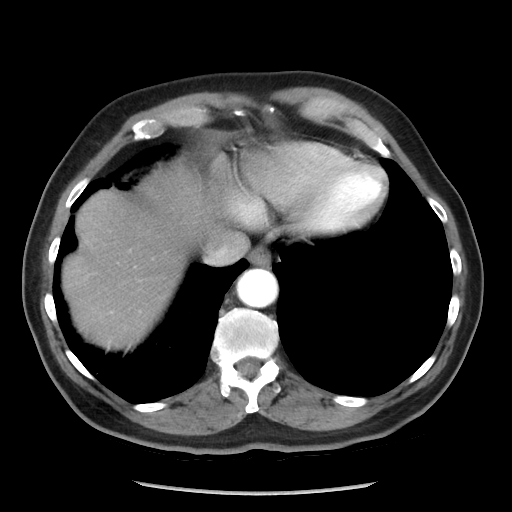
[im 70/80  lung]
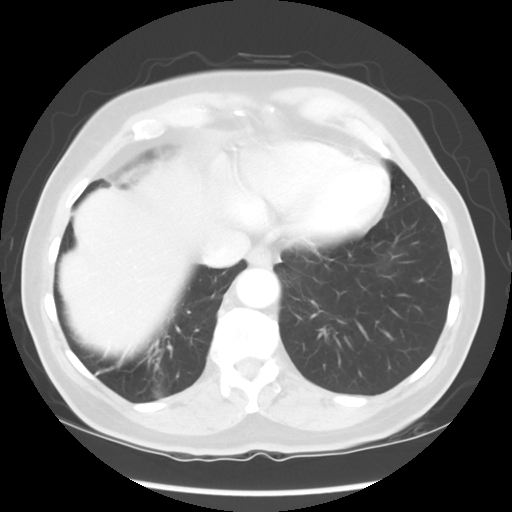

[Series 601: coronal body · coronal · 0.88mm/px · 1 of 123 slices shown, 2 images]
[im 41/123  soft-tissue]
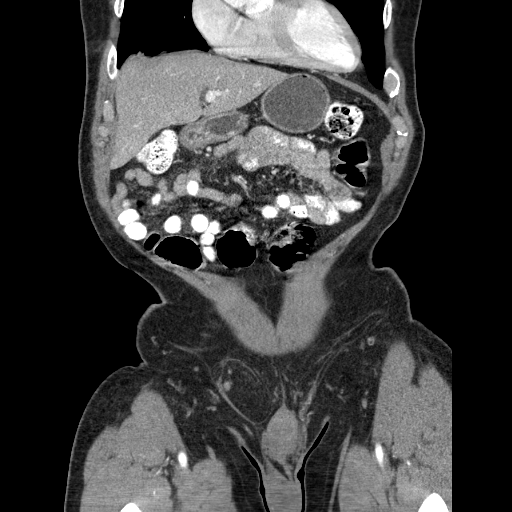
[im 41/123  bone]
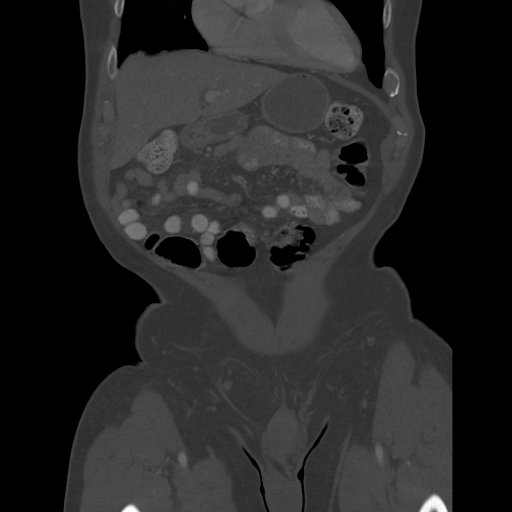

[Series 602: sagittal body · sagittal · 0.88mm/px · 4 of 144 slices shown]
[im 9/144  soft-tissue]
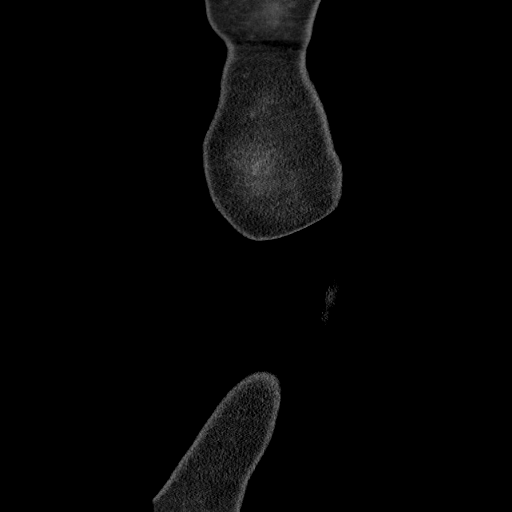
[im 27/144  soft-tissue]
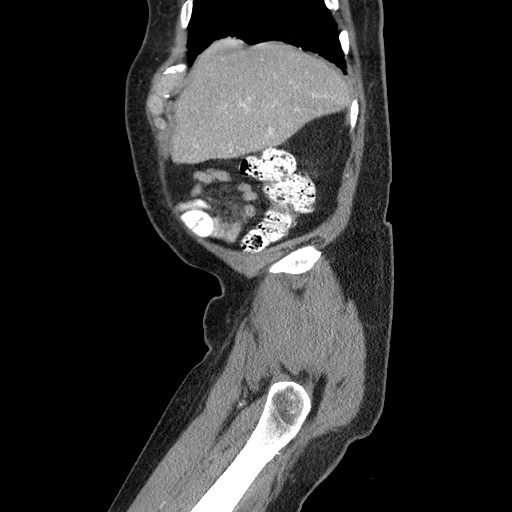
[im 45/144  soft-tissue]
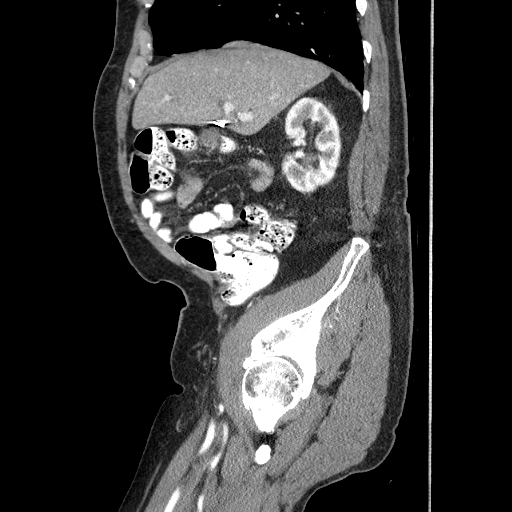
[im 63/144  soft-tissue]
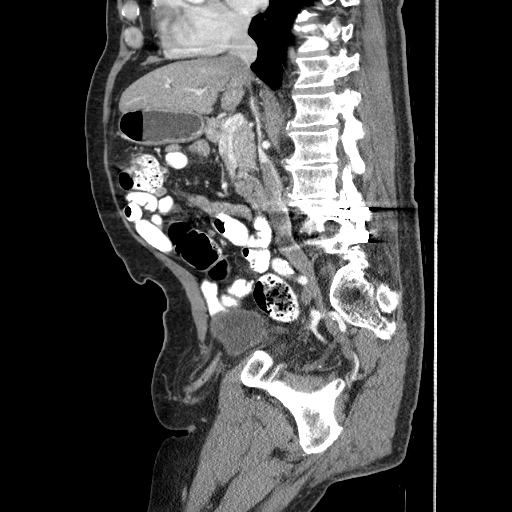

[12 of 36 positions shown; findings below may reference images not displayed]

FINDINGS: Lower chest:  Unremarkable

Hepatobiliary: Tiny hypoattenuating lesion anterior dome left liver
is too small to characterize but most likely benign and probably a
cyst. Gallbladder surgically absent. No intrahepatic or extrahepatic
biliary dilation.

Pancreas: No focal mass lesion. No dilatation of the main duct. No
intraparenchymal cyst. No peripancreatic edema.

Spleen: No splenomegaly. No focal mass lesion.

Adrenals/Urinary Tract: No adrenal nodule or mass. Kidneys are
unremarkable. No evidence for hydroureter. Moderate bladder
distention noted.

Stomach/Bowel: Stomach is nondistended. No gastric wall thickening.
No evidence of outlet obstruction. Duodenum is normally positioned
as is the ligament of Treitz. No small bowel wall thickening. No
small bowel dilatation. The terminal ileum is normal. The appendix
is normal, but extends into a right groin hernia. Diverticular
changes are noted in the left colon without evidence of
diverticulitis.

Vascular/Lymphatic: There is abdominal aortic atherosclerosis
without aneurysm. There is no gastrohepatic or hepatoduodenal
ligament lymphadenopathy. No intraperitoneal or retroperitoneal
lymphadenopathy. No pelvic sidewall lymphadenopathy.

Reproductive: The prostate gland and seminal vesicles have normal
imaging features.

Other: No intraperitoneal free fluid.

Musculoskeletal: Tiny umbilical hernia contains a small portion of
small bowel without small bowel wall thickening or obstruction. No
edema or fluid associated with the hernia. A right groin hernia
contains fat and a normal appearing appendix. Degenerative changes
noted right hip. Patient is status post lower lumbar fusion. Bone
windows reveal no worrisome lytic or sclerotic osseous lesions.
IMPRESSION: 1. No acute findings in the abdomen or pelvis.
2. Very tiny umbilical hernia containing a knuckle of small bowel
without complicating features.
3. Right groin hernia mostly fat although the normal appearing
appendix does extend down into the hernia sac.
4.  Aortic Atherosclerois ([id number])

## 2020-05-05 DIAGNOSIS — R7309 Other abnormal glucose: Secondary | ICD-10-CM | POA: Diagnosis not present

## 2020-05-05 DIAGNOSIS — K130 Diseases of lips: Secondary | ICD-10-CM | POA: Diagnosis not present

## 2020-05-05 DIAGNOSIS — Z6826 Body mass index (BMI) 26.0-26.9, adult: Secondary | ICD-10-CM | POA: Diagnosis not present

## 2020-05-20 DIAGNOSIS — Z85830 Personal history of malignant neoplasm of bone: Secondary | ICD-10-CM | POA: Diagnosis not present

## 2020-05-20 DIAGNOSIS — Z08 Encounter for follow-up examination after completed treatment for malignant neoplasm: Secondary | ICD-10-CM | POA: Diagnosis not present

## 2020-05-20 DIAGNOSIS — Z96642 Presence of left artificial hip joint: Secondary | ICD-10-CM | POA: Diagnosis not present

## 2020-05-20 DIAGNOSIS — Z9889 Other specified postprocedural states: Secondary | ICD-10-CM | POA: Diagnosis not present

## 2020-05-20 DIAGNOSIS — C4022 Malignant neoplasm of long bones of left lower limb: Secondary | ICD-10-CM | POA: Diagnosis not present

## 2020-08-23 ENCOUNTER — Ambulatory Visit: Payer: Self-pay | Admitting: Surgery

## 2020-08-23 DIAGNOSIS — K429 Umbilical hernia without obstruction or gangrene: Secondary | ICD-10-CM | POA: Diagnosis not present

## 2020-08-23 DIAGNOSIS — K4090 Unilateral inguinal hernia, without obstruction or gangrene, not specified as recurrent: Secondary | ICD-10-CM | POA: Diagnosis not present

## 2020-08-24 ENCOUNTER — Ambulatory Visit: Payer: Self-pay | Admitting: Surgery

## 2020-08-24 NOTE — H&P (Signed)
History of Present Illness Kevin Mcintyre. Kevin Sedlar MD; 08/24/2020 9:04 AM) The patient is a 86 year old male who presents with an inguinal hernia. Referred by Dr. Maury Dus for right inguinal hernia  This is an 86 year old male in relatively good health who presents with a four year history of a slowly enlarging right inguinal hernia. This was documented on a CT scan in 2018. He denies any obstructive symptoms, but the hernia has become slightly larger and is causing more discomfort. The patient also has a small umbilical hernia after a previous laparoscopic cholecystectomy. He presents now to discuss repair of the right inguinal hernia.   Problem List/Past Medical Kevin Key K. Porfiria Heinrich, MD; 08/24/2020 9:04 AM) INGUINAL HERNIA OF RIGHT SIDE WITHOUT OBSTRUCTION OR GANGRENE (R67.89)  UMBILICAL HERNIA WITHOUT OBSTRUCTION OR GANGRENE (K42.9)   Past Surgical History Kevin Lorenzo, LPN; 38/10/173 10:25 AM) Gallbladder Surgery - Laparoscopic  Spinal Surgery - Lower Back  Spinal Surgery - Neck   Diagnostic Studies History Kevin Lorenzo, LPN; 85/11/7780 42:35 AM) Colonoscopy  1-5 years ago  Allergies Kevin Lorenzo, LPN; 36/10/4429 54:00 AM) No Known Drug Allergies  [08/23/2020]: Allergies Reconciled   Medication History Kevin Lorenzo, LPN; 86/04/6194 09:32 AM) traMADol HCl (50MG  Tablet, Oral) Active. Pantoprazole Sodium (40MG  Tablet DR, Oral) Active. Losartan Potassium-HCTZ (50-12.5MG  Tablet, Oral) Active. Fluticasone Propionate (50MCG/ACT Suspension, Nasal) Active. Stool Softener (100MG  Capsule, Oral) Active. Medications Reconciled  Social History Kevin Lorenzo, LPN; 67/10/2456 09:98 AM) Alcohol use  Remotely quit alcohol use. Caffeine use  Carbonated beverages. No drug use  Tobacco use  Never smoker.  Family History Kevin Lorenzo, LPN; 33/05/2504 39:76 AM) Arthritis  Father, Mother. Hypertension  Father.  Other Problems Kevin Mcintyre. Kevin Bachtel, MD; 08/24/2020 9:04  AM) Arthritis  Back Pain  Cancer  Gastroesophageal Reflux Disease     Review of Systems Kevin Billings [facility] LPN; 73/01/1934 90:24 AM) General Not Present- Appetite Loss, Chills, Fatigue, Fever, Night Sweats, Weight Gain and Weight Loss. Skin Not Present- Change in Wart/Mole, Dryness, Hives, Jaundice, New Lesions, Non-Healing Wounds, Rash and Ulcer. HEENT Present- Wears glasses/contact lenses. Not Present- Earache, Hearing Loss, Hoarseness, Nose Bleed, Oral Ulcers, Ringing in the Ears, Seasonal Allergies, Sinus Pain, Sore Throat, Visual Disturbances and Yellow Eyes. Respiratory Not Present- Bloody sputum, Chronic Cough, Difficulty Breathing, Snoring and Wheezing. Breast Not Present- Breast Mass, Breast Pain, Nipple Discharge and Skin Changes. Cardiovascular Not Present- Chest Pain, Difficulty Breathing Lying Down, Leg Cramps, Palpitations, Rapid Heart Rate, Shortness of Breath and Swelling of Extremities. Gastrointestinal Not Present- Abdominal Pain, Bloating, Bloody Stool, Change in Bowel Habits, Chronic diarrhea, Constipation, Difficulty Swallowing, Excessive gas, Gets full quickly at meals, Hemorrhoids, Indigestion, Nausea, Rectal Pain and Vomiting. Male Genitourinary Not Present- Blood in Urine, Change in Urinary Stream, Frequency, Impotence, Nocturia, Painful Urination, Urgency and Urine Leakage. Musculoskeletal Present- Back Pain and Joint Pain. Not Present- Joint Stiffness, Muscle Pain, Muscle Weakness and Swelling of Extremities. Neurological Present- Trouble walking. Not Present- Decreased Memory, Fainting, Headaches, Numbness, Seizures, Tingling, Tremor and Weakness. Psychiatric Not Present- Anxiety, Bipolar, Change in Sleep Pattern, Depression, Fearful and Frequent crying. Endocrine Not Present- Cold Intolerance, Excessive Hunger, Hair Changes, Heat Intolerance, Hot flashes and New Diabetes. Hematology Present- Blood Thinners. Not Present- Easy Bruising, Excessive bleeding, Gland  problems, HIV and Persistent Infections.  Vitals Kevin Billings Dockery LPN; 06/30/3531 99:24 AM) 08/23/2020 10:44 AM Weight: 174.4 lb Height: 67in Body Surface Area: 1.91 m Body Mass Index: 27.31 kg/m  Temp.: 97.56F (Infrared)  Pulse: 70 (Regular)  BP: 144/82(Sitting, Left Arm,  Standard)       Physical Exam Kevin Key K. Kao Berkheimer MD; 08/24/2020 9:05 AM) The physical exam findings are as follows: Note: Constitutional: WDWN in NAD, conversant, no obvious deformities; resting comfortably Eyes: Pupils equal, round; sclera anicteric; moist conjunctiva; no lid lag HENT: Oral mucosa moist; good dentition Neck: No masses palpated, trachea midline; no thyromegaly Lungs: CTA bilaterally; normal respiratory effort CV: Regular rate and rhythm; no murmurs; extremities well-perfused with no edema Abd: +bowel sounds, soft, non-tender, no palpable organomegaly; small umbilical hernia - asymptomatic GU: bilateral descended testes; no testicular masses; reducible RIH; no sign of LIH Musc: Normal gait; no apparent clubbing or cyanosis in extremities Lymphatic: No palpable cervical or axillary lymphadenopathy Skin: Warm, dry; no sign of jaundice Psychiatric - alert and oriented x 4; calm mood and affect    Assessment & Plan Kevin Key K. Jonnae Fonseca MD; 08/23/2020 10:03 PM) INGUINAL HERNIA OF RIGHT SIDE WITHOUT OBSTRUCTION OR GANGRENE (L21.74) UMBILICAL HERNIA WITHOUT OBSTRUCTION OR GANGRENE (K42.9) Current Plans Schedule for Surgery - Right inguinal hernia repair with mesh/ umbilical hernia repair. The surgical procedure has been discussed with the patient. Potential risks, benefits, alternative treatments, and expected outcomes have been explained. All of the patient's questions at this time have been answered. The likelihood of reaching the patient's treatment goal is good. The patient understand the proposed surgical procedure and wishes to proceed.   Kevin Mcintyre. Kevin Dover, MD, [facility]  Surgery  General/ Trauma Surgery   08/24/2020 2:25 PM

## 2020-08-24 NOTE — H&P (View-Only) (Signed)
History of Present Illness Kevin Mcintyre. Kevin Willis MD; 08/24/2020 9:04 AM) The patient is a 86 year old male who presents with an inguinal hernia. Referred by Dr. Maury Dus for right inguinal hernia  This is an 86 year old male in relatively good health who presents with a four year history of a slowly enlarging right inguinal hernia. This was documented on a CT scan in 2018. He denies any obstructive symptoms, but the hernia has become slightly larger and is causing more discomfort. The patient also has a small umbilical hernia after a previous laparoscopic cholecystectomy. He presents now to discuss repair of the right inguinal hernia.   Problem List/Past Medical Kevin Key K. Rogen Porte, MD; 08/24/2020 9:04 AM) INGUINAL HERNIA OF RIGHT SIDE WITHOUT OBSTRUCTION OR GANGRENE (Y10.17)  UMBILICAL HERNIA WITHOUT OBSTRUCTION OR GANGRENE (K42.9)   Past Surgical History Kevin Lorenzo, LPN; 51/0/2585 27:78 AM) Gallbladder Surgery - Laparoscopic  Spinal Surgery - Lower Back  Spinal Surgery - Neck   Diagnostic Studies History Kevin Lorenzo, LPN; 24/11/3534 14:43 AM) Colonoscopy  1-5 years ago  Allergies Kevin Lorenzo, LPN; 15/01/85 76:19 AM) No Known Drug Allergies  [08/23/2020]: Allergies Reconciled   Medication History Kevin Lorenzo, LPN; 50/06/3266 12:45 AM) traMADol HCl (50MG  Tablet, Oral) Active. Pantoprazole Sodium (40MG  Tablet DR, Oral) Active. Losartan Potassium-HCTZ (50-12.5MG  Tablet, Oral) Active. Fluticasone Propionate (50MCG/ACT Suspension, Nasal) Active. Stool Softener (100MG  Capsule, Oral) Active. Medications Reconciled  Social History Kevin Lorenzo, LPN; 80/06/9832 82:50 AM) Alcohol use  Remotely quit alcohol use. Caffeine use  Carbonated beverages. No drug use  Tobacco use  Never smoker.  Family History Kevin Lorenzo, LPN; 53/06/7672 41:93 AM) Arthritis  Father, Mother. Hypertension  Father.  Other Problems Kevin Mcintyre. Kevin Situ, MD; 08/24/2020 9:04  AM) Arthritis  Back Pain  Cancer  Gastroesophageal Reflux Disease     Review of Systems Kevin [facility] LPN; 79/0/2409 73:53 AM) General Not Present- Appetite Loss, Chills, Fatigue, Fever, Night Sweats, Weight Gain and Weight Loss. Skin Not Present- Change in Wart/Mole, Dryness, Hives, Jaundice, New Lesions, Non-Healing Wounds, Rash and Ulcer. HEENT Present- Wears glasses/contact lenses. Not Present- Earache, Hearing Loss, Hoarseness, Nose Bleed, Oral Ulcers, Ringing in the Ears, Seasonal Allergies, Sinus Pain, Sore Throat, Visual Disturbances and Yellow Eyes. Respiratory Not Present- Bloody sputum, Chronic Cough, Difficulty Breathing, Snoring and Wheezing. Breast Not Present- Breast Mass, Breast Pain, Nipple Discharge and Skin Changes. Cardiovascular Not Present- Chest Pain, Difficulty Breathing Lying Down, Leg Cramps, Palpitations, Rapid Heart Rate, Shortness of Breath and Swelling of Extremities. Gastrointestinal Not Present- Abdominal Pain, Bloating, Bloody Stool, Change in Bowel Habits, Chronic diarrhea, Constipation, Difficulty Swallowing, Excessive gas, Gets full quickly at meals, Hemorrhoids, Indigestion, Nausea, Rectal Pain and Vomiting. Male Genitourinary Not Present- Blood in Urine, Change in Urinary Stream, Frequency, Impotence, Nocturia, Painful Urination, Urgency and Urine Leakage. Musculoskeletal Present- Back Pain and Joint Pain. Not Present- Joint Stiffness, Muscle Pain, Muscle Weakness and Swelling of Extremities. Neurological Present- Trouble walking. Not Present- Decreased Memory, Fainting, Headaches, Numbness, Seizures, Tingling, Tremor and Weakness. Psychiatric Not Present- Anxiety, Bipolar, Change in Sleep Pattern, Depression, Fearful and Frequent crying. Endocrine Not Present- Cold Intolerance, Excessive Hunger, Hair Changes, Heat Intolerance, Hot flashes and New Diabetes. Hematology Present- Blood Thinners. Not Present- Easy Bruising, Excessive bleeding, Gland  problems, HIV and Persistent Infections.  Vitals Kevin Billings Dockery LPN; 29/06/2425 83:41 AM) 08/23/2020 10:44 AM Weight: 174.4 lb Height: 67in Body Surface Area: 1.91 m Body Mass Index: 27.31 kg/m  Temp.: 97.34F (Infrared)  Pulse: 70 (Regular)  BP: 144/82(Sitting, Left Arm,  Standard)       Physical Exam Kevin Key K. Melchizedek Espinola MD; 08/24/2020 9:05 AM) The physical exam findings are as follows: Note: Constitutional: WDWN in NAD, conversant, no obvious deformities; resting comfortably Eyes: Pupils equal, round; sclera anicteric; moist conjunctiva; no lid lag HENT: Oral mucosa moist; good dentition Neck: No masses palpated, trachea midline; no thyromegaly Lungs: CTA bilaterally; normal respiratory effort CV: Regular rate and rhythm; no murmurs; extremities well-perfused with no edema Abd: +bowel sounds, soft, non-tender, no palpable organomegaly; small umbilical hernia - asymptomatic GU: bilateral descended testes; no testicular masses; reducible RIH; no sign of LIH Musc: Normal gait; no apparent clubbing or cyanosis in extremities Lymphatic: No palpable cervical or axillary lymphadenopathy Skin: Warm, dry; no sign of jaundice Psychiatric - alert and oriented x 4; calm mood and affect    Assessment & Plan Kevin Key K. Eduardo Honor MD; 08/23/2020 10:03 PM) INGUINAL HERNIA OF RIGHT SIDE WITHOUT OBSTRUCTION OR GANGRENE (K91.79) UMBILICAL HERNIA WITHOUT OBSTRUCTION OR GANGRENE (K42.9) Current Plans Schedule for Surgery - Right inguinal hernia repair with mesh/ umbilical hernia repair. The surgical procedure has been discussed with the patient. Potential risks, benefits, alternative treatments, and expected outcomes have been explained. All of the patient's questions at this time have been answered. The likelihood of reaching the patient's treatment goal is good. The patient understand the proposed surgical procedure and wishes to proceed.   Kevin Mcintyre. Georgette Dover, MD, [facility]  Surgery  General/ Trauma Surgery   08/24/2020 2:25 PM

## 2020-08-26 DIAGNOSIS — Z85830 Personal history of malignant neoplasm of bone: Secondary | ICD-10-CM | POA: Diagnosis not present

## 2020-08-26 DIAGNOSIS — M47814 Spondylosis without myelopathy or radiculopathy, thoracic region: Secondary | ICD-10-CM | POA: Diagnosis not present

## 2020-08-26 DIAGNOSIS — Z23 Encounter for immunization: Secondary | ICD-10-CM | POA: Diagnosis not present

## 2020-08-26 DIAGNOSIS — C4022 Malignant neoplasm of long bones of left lower limb: Secondary | ICD-10-CM | POA: Diagnosis not present

## 2020-08-26 DIAGNOSIS — M1611 Unilateral primary osteoarthritis, right hip: Secondary | ICD-10-CM | POA: Diagnosis not present

## 2020-08-26 DIAGNOSIS — Z08 Encounter for follow-up examination after completed treatment for malignant neoplasm: Secondary | ICD-10-CM | POA: Diagnosis not present

## 2020-09-06 DIAGNOSIS — Z0000 Encounter for general adult medical examination without abnormal findings: Secondary | ICD-10-CM | POA: Diagnosis not present

## 2020-09-06 DIAGNOSIS — C4022 Malignant neoplasm of long bones of left lower limb: Secondary | ICD-10-CM | POA: Diagnosis not present

## 2020-09-06 DIAGNOSIS — K4090 Unilateral inguinal hernia, without obstruction or gangrene, not specified as recurrent: Secondary | ICD-10-CM | POA: Diagnosis not present

## 2020-09-06 DIAGNOSIS — Z1389 Encounter for screening for other disorder: Secondary | ICD-10-CM | POA: Diagnosis not present

## 2020-09-06 DIAGNOSIS — Z23 Encounter for immunization: Secondary | ICD-10-CM | POA: Diagnosis not present

## 2020-09-06 DIAGNOSIS — I10 Essential (primary) hypertension: Secondary | ICD-10-CM | POA: Diagnosis not present

## 2020-09-06 DIAGNOSIS — R1013 Epigastric pain: Secondary | ICD-10-CM | POA: Diagnosis not present

## 2020-09-06 DIAGNOSIS — M47812 Spondylosis without myelopathy or radiculopathy, cervical region: Secondary | ICD-10-CM | POA: Diagnosis not present

## 2020-09-06 DIAGNOSIS — R7309 Other abnormal glucose: Secondary | ICD-10-CM | POA: Diagnosis not present

## 2020-09-10 ENCOUNTER — Encounter (HOSPITAL_BASED_OUTPATIENT_CLINIC_OR_DEPARTMENT_OTHER): Payer: Self-pay | Admitting: Surgery

## 2020-09-10 ENCOUNTER — Other Ambulatory Visit: Payer: Self-pay

## 2020-09-18 ENCOUNTER — Other Ambulatory Visit (HOSPITAL_COMMUNITY)
Admission: RE | Admit: 2020-09-18 | Discharge: 2020-09-18 | Disposition: A | Discharge status: Home / Self Care | Payer: Medicare PPO | Source: Ambulatory Visit | Attending: Surgery | Admitting: Surgery

## 2020-09-18 DIAGNOSIS — Z20822 Contact with and (suspected) exposure to covid-19: Secondary | ICD-10-CM | POA: Insufficient documentation

## 2020-09-18 DIAGNOSIS — Z01812 Encounter for preprocedural laboratory examination: Secondary | ICD-10-CM | POA: Diagnosis not present

## 2020-09-19 LAB — SARS CORONAVIRUS 2 (TAT 6-24 HRS): SARS Coronavirus 2: NEGATIVE

## 2020-09-20 ENCOUNTER — Encounter (HOSPITAL_BASED_OUTPATIENT_CLINIC_OR_DEPARTMENT_OTHER)
Admission: RE | Admit: 2020-09-20 | Discharge: 2020-09-20 | Disposition: A | Discharge status: Home / Self Care | Payer: Medicare PPO | Source: Ambulatory Visit | Attending: Surgery | Admitting: Surgery

## 2020-09-20 DIAGNOSIS — Z7951 Long term (current) use of inhaled steroids: Secondary | ICD-10-CM | POA: Diagnosis not present

## 2020-09-20 DIAGNOSIS — K429 Umbilical hernia without obstruction or gangrene: Secondary | ICD-10-CM | POA: Diagnosis not present

## 2020-09-20 DIAGNOSIS — Z79899 Other long term (current) drug therapy: Secondary | ICD-10-CM | POA: Diagnosis not present

## 2020-09-20 DIAGNOSIS — K4090 Unilateral inguinal hernia, without obstruction or gangrene, not specified as recurrent: Secondary | ICD-10-CM | POA: Diagnosis not present

## 2020-09-20 LAB — BASIC METABOLIC PANEL
Anion gap: 8 (ref 5–15)
BUN: 21 mg/dL (ref 8–23)
CO2: 27 mmol/L (ref 22–32)
Calcium: 9.1 mg/dL (ref 8.9–10.3)
Chloride: 103 mmol/L (ref 98–111)
Creatinine, Ser: 1.25 mg/dL — ABNORMAL HIGH (ref 0.61–1.24)
GFR, Estimated: 57 mL/min — ABNORMAL LOW (ref >60)
Glucose, Bld: 120 mg/dL — ABNORMAL HIGH (ref 70–99)
Potassium: 4 mmol/L (ref 3.5–5.1)
Sodium: 138 mmol/L (ref 135–145)

## 2020-09-20 NOTE — Progress Notes (Addendum)

## 2020-09-21 ENCOUNTER — Encounter (HOSPITAL_BASED_OUTPATIENT_CLINIC_OR_DEPARTMENT_OTHER): Payer: Self-pay | Admitting: Surgery

## 2020-09-21 ENCOUNTER — Ambulatory Visit (HOSPITAL_BASED_OUTPATIENT_CLINIC_OR_DEPARTMENT_OTHER): Payer: Medicare PPO | Admitting: Anesthesiology

## 2020-09-21 ENCOUNTER — Other Ambulatory Visit: Payer: Self-pay

## 2020-09-21 ENCOUNTER — Encounter (HOSPITAL_BASED_OUTPATIENT_CLINIC_OR_DEPARTMENT_OTHER)
Admission: RE | Disposition: A | Discharge status: Home / Self Care | Payer: Self-pay | Source: Home / Self Care | Attending: Surgery

## 2020-09-21 ENCOUNTER — Ambulatory Visit (HOSPITAL_BASED_OUTPATIENT_CLINIC_OR_DEPARTMENT_OTHER)
Admission: RE | Admit: 2020-09-21 | Discharge: 2020-09-21 | Disposition: A | Discharge status: Home / Self Care | Payer: Medicare PPO | Attending: Surgery | Admitting: Surgery

## 2020-09-21 DIAGNOSIS — Z79899 Other long term (current) drug therapy: Secondary | ICD-10-CM | POA: Diagnosis not present

## 2020-09-21 DIAGNOSIS — G8918 Other acute postprocedural pain: Secondary | ICD-10-CM | POA: Diagnosis not present

## 2020-09-21 DIAGNOSIS — K4090 Unilateral inguinal hernia, without obstruction or gangrene, not specified as recurrent: Secondary | ICD-10-CM | POA: Diagnosis not present

## 2020-09-21 DIAGNOSIS — Z7951 Long term (current) use of inhaled steroids: Secondary | ICD-10-CM | POA: Insufficient documentation

## 2020-09-21 DIAGNOSIS — I10 Essential (primary) hypertension: Secondary | ICD-10-CM | POA: Diagnosis not present

## 2020-09-21 DIAGNOSIS — K429 Umbilical hernia without obstruction or gangrene: Secondary | ICD-10-CM | POA: Insufficient documentation

## 2020-09-21 HISTORY — PX: INSERTION OF MESH: SHX5868

## 2020-09-21 HISTORY — PX: UMBILICAL HERNIA REPAIR: SHX196

## 2020-09-21 HISTORY — PX: INGUINAL HERNIA REPAIR: SHX194

## 2020-09-21 SURGERY — REPAIR, HERNIA, INGUINAL, ADULT
Anesthesia: Regional | Site: Groin | Laterality: Right

## 2020-09-21 MED ORDER — DEXAMETHASONE SODIUM PHOSPHATE 10 MG/ML IJ SOLN
INTRAMUSCULAR | Status: AC
  Filled 2020-09-21: qty 1

## 2020-09-21 MED ORDER — PROPOFOL 10 MG/ML IV BOLUS
INTRAVENOUS | Status: DC | PRN
  Administered 2020-09-21: 140 mg via INTRAVENOUS

## 2020-09-21 MED ORDER — DEXAMETHASONE SODIUM PHOSPHATE 10 MG/ML IJ SOLN
INTRAMUSCULAR | Status: DC | PRN
  Administered 2020-09-21: 5 mg via INTRAVENOUS

## 2020-09-21 MED ORDER — OXYCODONE HCL 5 MG PO TABS
5.0000 mg | ORAL_TABLET | Freq: Once | ORAL | Status: AC | PRN
  Administered 2020-09-21: 5 mg via ORAL

## 2020-09-21 MED ORDER — ONDANSETRON HCL 4 MG/2ML IJ SOLN
INTRAMUSCULAR | Status: DC | PRN
  Administered 2020-09-21: 4 mg via INTRAVENOUS

## 2020-09-21 MED ORDER — CEFAZOLIN SODIUM-DEXTROSE 2-4 GM/100ML-% IV SOLN
INTRAVENOUS | Status: AC
  Filled 2020-09-21: qty 100

## 2020-09-21 MED ORDER — BUPIVACAINE HCL (PF) 0.5 % IJ SOLN
INTRAMUSCULAR | Status: DC | PRN
  Administered 2020-09-21: 15 mL via PERINEURAL

## 2020-09-21 MED ORDER — EPHEDRINE SULFATE 50 MG/ML IJ SOLN
INTRAMUSCULAR | Status: DC | PRN
  Administered 2020-09-21 (×2): 10 mg via INTRAVENOUS

## 2020-09-21 MED ORDER — BUPIVACAINE HCL 0.25 % IJ SOLN
INTRAMUSCULAR | Status: DC | PRN
  Administered 2020-09-21: 20 mL

## 2020-09-21 MED ORDER — FENTANYL CITRATE (PF) 100 MCG/2ML IJ SOLN
INTRAMUSCULAR | Status: AC
  Filled 2020-09-21: qty 2

## 2020-09-21 MED ORDER — OXYCODONE HCL 5 MG/5ML PO SOLN: 5.0000 mg | Freq: Once | ORAL | Status: AC | PRN

## 2020-09-21 MED ORDER — CEFAZOLIN SODIUM-DEXTROSE 2-4 GM/100ML-% IV SOLN
2.0000 g | INTRAVENOUS | Status: AC
  Administered 2020-09-21: 2 g via INTRAVENOUS

## 2020-09-21 MED ORDER — OXYCODONE HCL 5 MG PO TABS
ORAL_TABLET | ORAL | Status: AC
  Filled 2020-09-21: qty 1

## 2020-09-21 MED ORDER — EPHEDRINE 5 MG/ML INJ
INTRAVENOUS | Status: AC
  Filled 2020-09-21: qty 10

## 2020-09-21 MED ORDER — FENTANYL CITRATE (PF) 100 MCG/2ML IJ SOLN
INTRAMUSCULAR | Status: DC | PRN
  Administered 2020-09-21 (×2): 25 ug via INTRAVENOUS
  Administered 2020-09-21: 50 ug via INTRAVENOUS

## 2020-09-21 MED ORDER — ACETAMINOPHEN 10 MG/ML IV SOLN: 1000.0000 mg | Freq: Once | INTRAVENOUS | Status: DC | PRN

## 2020-09-21 MED ORDER — FENTANYL CITRATE (PF) 100 MCG/2ML IJ SOLN: 25.0000 ug | INTRAMUSCULAR | Status: DC | PRN

## 2020-09-21 MED ORDER — GABAPENTIN 300 MG PO CAPS
ORAL_CAPSULE | ORAL | Status: AC
  Filled 2020-09-21: qty 1

## 2020-09-21 MED ORDER — ACETAMINOPHEN 160 MG/5ML PO SOLN: 1000.0000 mg | Freq: Once | ORAL | Status: DC | PRN

## 2020-09-21 MED ORDER — OXYCODONE HCL 5 MG PO TABS: 5.0000 mg | ORAL_TABLET | Freq: Four times a day (QID) | ORAL | 0 refills | Status: DC | PRN

## 2020-09-21 MED ORDER — ACETAMINOPHEN 500 MG PO TABS
ORAL_TABLET | ORAL | Status: AC
  Filled 2020-09-21: qty 2

## 2020-09-21 MED ORDER — CHLORHEXIDINE GLUCONATE CLOTH 2 % EX PADS: 6.0000 | MEDICATED_PAD | Freq: Once | CUTANEOUS | Status: DC

## 2020-09-21 MED ORDER — ONDANSETRON HCL 4 MG/2ML IJ SOLN
INTRAMUSCULAR | Status: AC
  Filled 2020-09-21: qty 2

## 2020-09-21 MED ORDER — PROPOFOL 10 MG/ML IV BOLUS
INTRAVENOUS | Status: AC
  Filled 2020-09-21: qty 20

## 2020-09-21 MED ORDER — BUPIVACAINE LIPOSOME 1.3 % IJ SUSP
INTRAMUSCULAR | Status: DC | PRN
  Administered 2020-09-21: 133 mg via PERINEURAL

## 2020-09-21 MED ORDER — ACETAMINOPHEN 500 MG PO TABS
1000.0000 mg | ORAL_TABLET | ORAL | Status: AC
  Administered 2020-09-21: 1000 mg via ORAL

## 2020-09-21 MED ORDER — GABAPENTIN 300 MG PO CAPS: 300.0000 mg | ORAL_CAPSULE | ORAL | Status: DC

## 2020-09-21 MED ORDER — ACETAMINOPHEN 500 MG PO TABS: 1000.0000 mg | ORAL_TABLET | Freq: Once | ORAL | Status: DC | PRN

## 2020-09-21 MED ORDER — LIDOCAINE HCL (CARDIAC) PF 100 MG/5ML IV SOSY
PREFILLED_SYRINGE | INTRAVENOUS | Status: DC | PRN
  Administered 2020-09-21: 50 mg via INTRATRACHEAL

## 2020-09-21 MED ORDER — LACTATED RINGERS IV SOLN: INTRAVENOUS | Status: DC

## 2020-09-21 SURGICAL SUPPLY — 76 items
APL PRP STRL LF DISP 70% ISPRP (MISCELLANEOUS) ×2
APL SKNCLS STERI-STRIP NONHPOA (GAUZE/BANDAGES/DRESSINGS) ×2
APL SWBSTK 6 STRL LF DISP (MISCELLANEOUS)
APPLICATOR COTTON TIP 6 STRL (MISCELLANEOUS) IMPLANT
APPLICATOR COTTON TIP 6IN STRL (MISCELLANEOUS)
BENZOIN TINCTURE PRP APPL 2/3 (GAUZE/BANDAGES/DRESSINGS) ×4 IMPLANT
BLADE CLIPPER SURG (BLADE) ×4 IMPLANT
BLADE HEX COATED 2.75 (ELECTRODE) ×4 IMPLANT
BLADE SURG 15 STRL LF DISP TIS (BLADE) ×2 IMPLANT
BLADE SURG 15 STRL SS (BLADE) ×4
CANISTER SUCT 1200ML W/VALVE (MISCELLANEOUS) IMPLANT
CHLORAPREP W/TINT 26 (MISCELLANEOUS) ×4 IMPLANT
CLOSURE WOUND 1/2 X4 (GAUZE/BANDAGES/DRESSINGS) ×1
COVER BACK TABLE 60X90IN (DRAPES) ×4 IMPLANT
COVER MAYO STAND STRL (DRAPES) ×4 IMPLANT
COVER SURGICAL LIGHT HANDLE (MISCELLANEOUS) ×2 IMPLANT
COVER WAND RF STERILE (DRAPES) IMPLANT
DECANTER SPIKE VIAL GLASS SM (MISCELLANEOUS) ×4 IMPLANT
DRAIN PENROSE 1/2X12 LTX STRL (WOUND CARE) ×4 IMPLANT
DRAPE LAPAROTOMY 100X72 PEDS (DRAPES) ×4 IMPLANT
DRAPE LAPAROTOMY TRNSV 102X78 (DRAPES) ×4 IMPLANT
DRAPE UTILITY XL STRL (DRAPES) ×4 IMPLANT
DRSG TEGADERM 4X4.75 (GAUZE/BANDAGES/DRESSINGS) ×6 IMPLANT
ELECT REM PT RETURN 9FT ADLT (ELECTROSURGICAL) ×4
ELECTRODE REM PT RTRN 9FT ADLT (ELECTROSURGICAL) ×2 IMPLANT
GAUZE SPONGE 4X4 12PLY STRL (GAUZE/BANDAGES/DRESSINGS) ×4 IMPLANT
GAUZE SPONGE 4X4 12PLY STRL LF (GAUZE/BANDAGES/DRESSINGS) ×4 IMPLANT
GLOVE BIO SURGEON STRL SZ 6.5 (GLOVE) ×1 IMPLANT
GLOVE BIO SURGEON STRL SZ7 (GLOVE) ×6 IMPLANT
GLOVE BIO SURGEONS STRL SZ 6.5 (GLOVE) ×1
GLOVE BIOGEL PI IND STRL 7.5 (GLOVE) ×2 IMPLANT
GLOVE BIOGEL PI INDICATOR 7.5 (GLOVE) ×2
GOWN STRL REUS W/ TWL LRG LVL3 (GOWN DISPOSABLE) ×4 IMPLANT
GOWN STRL REUS W/TWL LRG LVL3 (GOWN DISPOSABLE) ×12
MESH PARIETEX PROGRIP RIGHT (Mesh General) ×2 IMPLANT
NDL HYPO 25X1 1.5 SAFETY (NEEDLE) ×2 IMPLANT
NDL SAFETY ECLIPSE 18X1.5 (NEEDLE) IMPLANT
NEEDLE HYPO 18GX1.5 SHARP (NEEDLE)
NEEDLE HYPO 25X1 1.5 SAFETY (NEEDLE) ×4 IMPLANT
NS IRRIG 1000ML POUR BTL (IV SOLUTION) ×2 IMPLANT
PACK BASIN DAY SURGERY FS (CUSTOM PROCEDURE TRAY) ×4 IMPLANT
PENCIL SMOKE EVACUATOR (MISCELLANEOUS) ×4 IMPLANT
SLEEVE SCD COMPRESS KNEE MED (MISCELLANEOUS) ×4 IMPLANT
SPONGE GAUZE 2X2 8PLY STER LF (GAUZE/BANDAGES/DRESSINGS) ×1
SPONGE GAUZE 2X2 8PLY STRL LF (GAUZE/BANDAGES/DRESSINGS) ×3 IMPLANT
SPONGE INTESTINAL PEANUT (DISPOSABLE) ×4 IMPLANT
STAPLER VISISTAT 35W (STAPLE) IMPLANT
STRIP CLOSURE SKIN 1/2X4 (GAUZE/BANDAGES/DRESSINGS) ×3 IMPLANT
SUT MNCRL AB 4-0 PS2 18 (SUTURE) ×4 IMPLANT
SUT MON AB 4-0 PC3 18 (SUTURE) ×2 IMPLANT
SUT NOVA 0 T19/GS 22DT (SUTURE) ×2 IMPLANT
SUT NOVA NAB DX-16 0-1 5-0 T12 (SUTURE) IMPLANT
SUT NOVA NAB GS-21 1 T12 (SUTURE) IMPLANT
SUT PDS AB 0 CT 36 (SUTURE) IMPLANT
SUT PROLENE 0 CT 2 (SUTURE) IMPLANT
SUT SILK 2 0 SH (SUTURE) IMPLANT
SUT SILK 3 0 TIES 17X18 (SUTURE)
SUT SILK 3-0 18XBRD TIE BLK (SUTURE) IMPLANT
SUT VIC AB 0 CT1 27 (SUTURE)
SUT VIC AB 0 CT1 27XBRD ANBCTR (SUTURE) IMPLANT
SUT VIC AB 2-0 CT1 27 (SUTURE)
SUT VIC AB 2-0 CT1 TAPERPNT 27 (SUTURE) IMPLANT
SUT VIC AB 2-0 SH 27 (SUTURE) ×4
SUT VIC AB 2-0 SH 27XBRD (SUTURE) ×2 IMPLANT
SUT VIC AB 3-0 SH 27 (SUTURE) ×8
SUT VIC AB 3-0 SH 27X BRD (SUTURE) ×2 IMPLANT
SUT VIC AB 4-0 BRD 54 (SUTURE) IMPLANT
SUT VIC AB 4-0 SH 18 (SUTURE) IMPLANT
SUT VICRYL 0 CT-2 (SUTURE) ×6 IMPLANT
SUT VICRYL 4-0 PS2 18IN ABS (SUTURE) IMPLANT
SYR BULB EAR ULCER 3OZ GRN STR (SYRINGE) IMPLANT
SYR CONTROL 10ML LL (SYRINGE) ×4 IMPLANT
TOWEL GREEN STERILE FF (TOWEL DISPOSABLE) ×4 IMPLANT
TUBE CONNECTING 20'X1/4 (TUBING)
TUBE CONNECTING 20X1/4 (TUBING) ×2 IMPLANT
YANKAUER SUCT BULB TIP NO VENT (SUCTIONS) ×4 IMPLANT

## 2020-09-21 NOTE — Discharge Instructions (Signed)
NO TYLENOL UNTIL 6PM IF NEEDED  Post Anesthesia Home Care Instructions  Activity: Get plenty of rest for the remainder of the day. A responsible individual must stay with you for 24 hours following the procedure.  For the next 24 hours, DO NOT: -Drive a car -Paediatric nurse -Drink alcoholic beverages -Take any medication unless instructed by your physician -Make any legal decisions or sign important papers.  Meals: Start with liquid foods such as gelatin or soup. Progress to regular foods as tolerated. Avoid greasy, spicy, heavy foods. If nausea and/or vomiting occur, drink only clear liquids until the nausea and/or vomiting subsides. Call your physician if vomiting continues.  Special Instructions/Symptoms: Your throat may feel dry or sore from the anesthesia or the breathing tube placed in your throat during surgery. If this causes discomfort, gargle with warm salt water. The discomfort should disappear within 24 hours.  If you had a scopolamine patch placed behind your ear for the management of post- operative nausea and/or vomiting:  1. The medication in the patch is effective for 72 hours, after which it should be removed.  Wrap patch in a tissue and discard in the trash. Wash hands thoroughly with soap and water. 2. You may remove the patch earlier than 72 hours if you experience unpleasant side effects which may include dry mouth, dizziness or visual disturbances. 3. Avoid touching the patch. Wash your hands with soap and water after contact with the patch.   Information for Discharge Teaching: EXPAREL (bupivacaine liposome injectable suspension)   Your surgeon or anesthesiologist gave you EXPAREL(bupivacaine) to help control your pain after surgery.   EXPAREL is a local anesthetic that provides pain relief by numbing the tissue around the surgical site.  EXPAREL is designed to release pain medication over time and can control pain for up to 72 hours.  Depending on how you  respond to EXPAREL, you may require less pain medication during your recovery.  Possible side effects:  Temporary loss of sensation or ability to move in the area where bupivacaine was injected.  Nausea, vomiting, constipation  Rarely, numbness and tingling in your mouth or lips, lightheadedness, or anxiety may occur.  Call your doctor right away if you think you may be experiencing any of these sensations, or if you have other questions regarding possible side effects.  Follow all other discharge instructions given to you by your surgeon or nurse. Eat a healthy diet and drink plenty of water or other fluids.  If you return to the hospital for any reason within 96 hours following the administration of EXPAREL, it is important for health care providers to know that you have received this anesthetic. A teal colored band has been placed on your arm with the date, time and amount of EXPAREL you have received in order to alert and inform your health care providers. Please leave this armband in place for the full 96 hours following administration, and then you may remove the band.     [facility] _______[facility]  UMBILICAL OR INGUINAL HERNIA REPAIR: POST OP INSTRUCTIONS  Always review your discharge instruction sheet given to you by the facility where your surgery was performed. IF YOU HAVE DISABILITY OR FAMILY LEAVE FORMS, YOU MUST BRING THEM TO THE OFFICE FOR PROCESSING.   DO NOT GIVE THEM TO YOUR DOCTOR.  1. A  prescription for pain medication may be given to you upon discharge.  Take your pain medication as prescribed, if needed.  If narcotic pain medicine is  not needed, then you may take acetaminophen (Tylenol) or ibuprofen (Advil) as needed. 2. Take your usually prescribed medications unless otherwise directed. If you need a refill on your pain medication, please contact your pharmacy.  They will contact our office to request authorization. Prescriptions will not be filled  after 5 pm or on week-ends. 3. You should follow a light diet the first 24 hours after arrival home, such as soup and crackers, etc.  Be sure to include lots of fluids daily.  Resume your normal diet the day after surgery. 4.Most patients will experience some swelling and bruising around the umbilicus or in the groin and scrotum.  Ice packs and reclining will help.  Swelling and bruising can take several days to resolve.  6. It is common to experience some constipation if taking pain medication after surgery.  Increasing fluid intake and taking a stool softener (such as Colace) will usually help or prevent this problem from occurring.  A mild laxative (Milk of Magnesia or Miralax) should be taken according to package directions if there are no bowel movements after 48 hours. 7. Unless discharge instructions indicate otherwise, you may remove your bandages 24-48 hours after surgery, and you may shower at that time.  You may have steri-strips (small skin tapes) in place directly over the incision.  These strips should be left on the skin for 7-10 days.  If your surgeon used skin glue on the incision, you may shower in 24 hours.  The glue will flake off over the next 2-3 weeks.  Any sutures or staples will be removed at the office during your follow-up visit. 8. ACTIVITIES:  You may resume regular (light) daily activities beginning the next day--such as daily self-care, walking, climbing stairs--gradually increasing activities as tolerated.  You may have sexual intercourse when it is comfortable.  Refrain from any heavy lifting or straining until approved by your doctor.  a.You may drive when you are no longer taking prescription pain medication, you can comfortably wear a seatbelt, and you can safely maneuver your car and apply brakes. b.RETURN TO WORK:   _____________________________________________  9.You should see your doctor in the office for a follow-up appointment approximately 2-3 weeks after your  surgery.  Make sure that you call for this appointment within a day or two after you arrive home to insure a convenient appointment time. 10.OTHER INSTRUCTIONS: _________________________    _____________________________________  WHEN TO CALL YOUR DOCTOR: 1. Fever over 101.0 2. Inability to urinate 3. Nausea and/or vomiting 4. Extreme swelling or bruising 5. Continued bleeding from incision. 6. Increased pain, redness, or drainage from the incision  The clinic staff is available to answer your questions during regular business hours.  Please don't hesitate to call and ask to speak to one of the nurses for clinical concerns.  If you have a medical emergency, go to the nearest emergency room or call 911.  A surgeon from [facility] is always on call at the hospital   [address] [place], [place], [place]  [address] ?  [address], [place], [place]   [address] [phone] ? [phone] ? FAX [phone] Web site: [email or web address]

## 2020-09-21 NOTE — Anesthesia Procedure Notes (Signed)
Procedure Name: LMA Insertion Date/Time: 09/21/2020 2:18 PM Performed by: Glory Buff, CRNA Pre-anesthesia Checklist: Patient identified, Emergency Drugs available, Suction available and Patient being monitored Patient Re-evaluated:Patient Re-evaluated prior to induction Oxygen Delivery Method: Circle system utilized Preoxygenation: Pre-oxygenation with 100% oxygen Induction Type: IV induction LMA: LMA inserted LMA Size: 4.0 Number of attempts: 1 Placement Confirmation: positive ETCO2 Tube secured with: Tape Dental Injury: Teeth and Oropharynx as per pre-operative assessment

## 2020-09-21 NOTE — Op Note (Addendum)
Hernia, Inguinal and Umbilical, Open, Procedure Note  Indications: 86 year old male in relatively good health who presents with a four year history of a slowly enlarging right inguinal hernia.  This was documented on a CT scan in 2018.  He denies any obstructive symptoms, but the hernia has become slightly larger and is causing more discomfort.  The patient also has a small umbilical hernia after a previous laparoscopic cholecystectomy.    Pre-operative Diagnosis: right reducible inguinal hernia, umbilical hernia   Post-operative Diagnosis: same  Surgeon: Donnie Mesa, MD  Assistants: Sheria Lang, MD  Anesthesia: General LMA anesthesia  ASA Class: 2  Procedure Details  The patient was seen again in the Holding Room. The risks, benefits, complications, treatment options, and expected outcomes were discussed with the patient. The possibilities of reaction to medication, pulmonary aspiration, perforation of viscus, bleeding, recurrent infection, the need for additional procedures, and development of a complication requiring transfusion or further operation were discussed with the patient and/or family. The likelihood of success in repairing the hernia and returning the patient to their previous functional status is good.  There was concurrence with the proposed plan, and informed consent was obtained. The site of surgery was properly noted/marked. The patient was taken to the Operating Room, identified as Kevin Mcintyre, and the procedure verified as right inguinal hernia repair and an umbilical hernia repair. A Time Out was held and the above information confirmed.  The patient was placed in the supine position and underwent induction of anesthesia. The abdomen and groin was prepped with Chloraprep and draped in the standard fashion, and 0.25% Marcaine with epinephrine was used to anesthetize the skin over the mid-portion of the inguinal canal and the periumbilical region. A transverse  supraumbilical incision was made. A combination of blunt and electrocautery dissection was made until the fascia and the hernia sac were encountered. The umbilical stalk was encircled bluntly and transected. The fascial borders of the hernia were identified and cleared sharply. The defect was approximately 1.5 cm in diameter. Five interrupted Prolene sutures were used to close the defect. The umbilicus was re-approximated to the fascia with a Vicryl suture. 3-0 Vicryl was used to close the subcutaneous tissues and 4-0 Monocryl was used to close the skin in subcuticular fashion.  Benzoin and steri-strips were used to seal the incision.  A clean dressing was applied.   Attention was then turned to the right inguinal hernia repair. An oblique incision was made over the right groin. Dissection was carried down through the subcutaneous tissue with cautery to the external oblique fascia.  We opened the external oblique fascia along the direction of its fibers to the external ring.  The spermatic cord was circumferentially dissected bluntly and retracted with a Penrose drain.  The ilioinguinal nerve was identified and preserved.  The floor of the inguinal canal was inspected was a direct inguinal hernia was identified. The contents of the hernia were separated from the cord, reduced, and the floor was buttressed with a running 0 Vicryl suture.  We then skeletonized the spermatic cord and a moderate cord lipoma was identified, separated from the cord, and reduced. The internal ring was then buttressed with a single figure of eight 0 Vicryl suture.  We used a right Progrip mesh which was inserted and deployed across the floor of the inguinal canal. The mesh was tucked underneath the external oblique fascia laterally.  The flap of the mesh was closed around the spermatic cord to recreate the internal  inguinal ring.  The mesh was secured to the pubic tubercle and inguinal ligament with an interrupted 0 Vicryl.  The external  oblique fascia was reapproximated with 2-0 Vicryl.  3-0 Vicryl was used to close the subcutaneous tissues and 4-0 Monocryl was used to close the skin in subcuticular fashion.  Benzoin and steri-strips were used to seal the incision.  A clean dressing was applied.  The patient was then extubated and brought to the recovery room in stable condition.  All sponge, instrument, and needle counts were correct prior to closure and at the conclusion of the case.   Estimated Blood Loss:  5 ml                 Complications: None; patient tolerated the procedure well.         Disposition: PACU - hemodynamically stable.         Condition: stable

## 2020-09-21 NOTE — Anesthesia Preprocedure Evaluation (Addendum)
Anesthesia Evaluation  Patient identified by MRN, date of birth, ID band Patient awake    Reviewed: Allergy & Precautions, NPO status , Patient's Chart, lab work & pertinent test results  History of Anesthesia Complications Negative for: history of anesthetic complications  Airway Mallampati: II  TM Distance: >3 FB Neck ROM: Full    Dental  (+) Dental Advisory Given, Edentulous Upper   Pulmonary neg shortness of breath, neg sleep apnea, neg COPD,  Covid-19 Nucleic Acid Test Results Lab Results      Component                Value               Date                      [place]              NEGATIVE            09/18/2020              breath sounds clear to auscultation       Cardiovascular hypertension, Pt. on medications (-) angina(-) Past MI and (-) CHF (-) dysrhythmias  Rhythm:Regular     Neuro/Psych negative neurological ROS  negative psych ROS   GI/Hepatic Neg liver ROS, GERD  Medicated and Controlled,No results found for: ALT, AST, GGT, ALKPHOS, BILITOT    Endo/Other    GLUCOSE                  120 (H)             09/20/2020       No results found for: HGBA1C   Renal/GU Renal InsufficiencyRenal diseaseLab Results      Component                Value               Date                      NA                       138                 09/20/2020                K                        4.0                 09/20/2020                CO2                      27                  09/20/2020                BUN                      21                  09/20/2020                CREATININE               1.25 (H)  09/20/2020                CALCIUM                  9.1                 09/20/2020                        Musculoskeletal  (+) Arthritis ,   Abdominal   Peds  Hematology negative hematology ROS (+) Lab Results      Component                Value               Date                      WBC                       6.7                 12/17/2015                HGB                      14.3                12/17/2015                HCT                      43.3                12/17/2015                MCV                      92.1                12/17/2015                PLT                      234                 12/17/2015              Anesthesia Other Findings   Reproductive/Obstetrics                            Anesthesia Physical Anesthesia Plan  ASA: II  Anesthesia Plan: General and Regional   Post-op Pain Management:  Regional for Post-op pain   Induction: Intravenous  PONV Risk Score and Plan: 2 and Ondansetron and Dexamethasone  Airway Management Planned: Oral ETT and LMA  Additional Equipment: None  Intra-op Plan:   Post-operative Plan: Extubation in OR  Informed Consent: I have reviewed the patients History and Physical, chart, labs and discussed the procedure including the risks, benefits and alternatives for the proposed anesthesia with the patient or authorized representative who has indicated his/her understanding and acceptance.     Dental advisory given  Plan Discussed with: CRNA and Surgeon  Anesthesia Plan Comments:         Anesthesia Quick Evaluation

## 2020-09-21 NOTE — Anesthesia Procedure Notes (Signed)
Anesthesia Regional Block: TAP block   Pre-Anesthetic Checklist: ,, timeout performed, Correct Patient, Correct Site, Correct Laterality, Correct Procedure, Correct Position, site marked, Risks and benefits discussed,  Surgical consent,  Pre-op evaluation,  At surgeon's request and post-op pain management  Laterality: Right  Prep: chloraprep       Needles:  Injection technique: Single-shot     Needle Length: 9cm  Needle Gauge: 22     Additional Needles: Arrow StimuQuik ECHO Echogenic Stimulating PNB Needle  Procedures:,,,, ultrasound used (permanent image in chart),,,,  Narrative:  Start time: 09/21/2020 2:21 PM End time: 09/21/2020 2:26 PM Injection made incrementally with aspirations every 5 mL.  Performed by: Personally  Anesthesiologist: Oleta Mouse, MD

## 2020-09-21 NOTE — Transfer of Care (Signed)
Immediate Anesthesia Transfer of Care Note  Patient: Kevin Mcintyre  Procedure(s) Performed: RIGHT INGUINAL HERNIA REPAIR WITH MESH (Right Groin) UMBILICAL HERNIA (N/A Abdomen) INSERTION OF MESH (Right Groin)  Patient Location: PACU  Anesthesia Type:General  Level of Consciousness: drowsy, patient cooperative and responds to stimulation  Airway & Oxygen Therapy: Patient Spontanous Breathing and Patient connected to face mask oxygen  Post-op Assessment: Report given to RN and Post -op Vital signs reviewed and stable  Post vital signs: Reviewed and stable  Last Vitals:  Vitals Value Taken Time  BP    Temp    Pulse 64 09/21/20 1528  Resp 14 09/21/20 1528  SpO2 100 % 09/21/20 1528  Vitals shown include unvalidated device data.  Last Pain:  Vitals:   09/21/20 1153  TempSrc: Oral  PainSc: 0-No pain         Complications: No complications documented.

## 2020-09-21 NOTE — Interval H&P Note (Signed)
History and Physical Interval Note:  09/21/2020 12:18 PM  Kevin Mcintyre  has presented today for surgery, with the diagnosis of right inguinal hernia/umbilical hernia.  The various methods of treatment have been discussed with the patient and family. After consideration of risks, benefits and other options for treatment, the patient has consented to  Procedure(s) with comments: [place] (Right) - LMA UMBILICAL HERNIA (N/A) - LMA as a surgical intervention.  The patient's history has been reviewed, patient examined, no change in status, stable for surgery.  I have reviewed the patient's chart and labs.  Questions were answered to the patient's satisfaction.     Maia Petties

## 2020-09-22 ENCOUNTER — Encounter (HOSPITAL_BASED_OUTPATIENT_CLINIC_OR_DEPARTMENT_OTHER): Payer: Self-pay | Admitting: Surgery

## 2020-09-23 NOTE — Anesthesia Postprocedure Evaluation (Signed)
Anesthesia Post Note  Patient: Kevin Mcintyre  Procedure(s) Performed: RIGHT INGUINAL HERNIA REPAIR WITH MESH (Right Groin) UMBILICAL HERNIA (N/A Abdomen) INSERTION OF MESH (Right Groin)     Patient location during evaluation: PACU Anesthesia Type: Regional and General Level of consciousness: awake and alert Pain management: pain level controlled Vital Signs Assessment: post-procedure vital signs reviewed and stable Respiratory status: spontaneous breathing, nonlabored ventilation, respiratory function stable and patient connected to nasal cannula oxygen Cardiovascular status: blood pressure returned to baseline and stable Postop Assessment: no apparent nausea or vomiting Anesthetic complications: no   No complications documented.  Last Vitals:  Vitals:   09/21/20 1600 09/21/20 1652  BP: (!) 143/83 (!) 146/81  Pulse: 60 72  Resp: 14 14  Temp:  36.6 C  SpO2: 100% 100%    Last Pain:  Vitals:   09/22/20 0955  TempSrc:   PainSc: 3                  Jadarion Halbig

## 2020-11-25 DIAGNOSIS — Z96642 Presence of left artificial hip joint: Secondary | ICD-10-CM | POA: Diagnosis not present

## 2020-11-25 DIAGNOSIS — Z08 Encounter for follow-up examination after completed treatment for malignant neoplasm: Secondary | ICD-10-CM | POA: Diagnosis not present

## 2020-11-25 DIAGNOSIS — E042 Nontoxic multinodular goiter: Secondary | ICD-10-CM | POA: Diagnosis not present

## 2020-11-25 DIAGNOSIS — R918 Other nonspecific abnormal finding of lung field: Secondary | ICD-10-CM | POA: Diagnosis not present

## 2020-11-25 DIAGNOSIS — I2510 Atherosclerotic heart disease of native coronary artery without angina pectoris: Secondary | ICD-10-CM | POA: Diagnosis not present

## 2020-11-25 DIAGNOSIS — C4022 Malignant neoplasm of long bones of left lower limb: Secondary | ICD-10-CM | POA: Diagnosis not present

## 2020-11-25 DIAGNOSIS — Z85830 Personal history of malignant neoplasm of bone: Secondary | ICD-10-CM | POA: Diagnosis not present

## 2021-02-23 DIAGNOSIS — R7303 Prediabetes: Secondary | ICD-10-CM | POA: Diagnosis not present

## 2021-02-23 DIAGNOSIS — J309 Allergic rhinitis, unspecified: Secondary | ICD-10-CM | POA: Diagnosis not present

## 2021-02-23 DIAGNOSIS — R1013 Epigastric pain: Secondary | ICD-10-CM | POA: Diagnosis not present

## 2021-02-23 DIAGNOSIS — K4090 Unilateral inguinal hernia, without obstruction or gangrene, not specified as recurrent: Secondary | ICD-10-CM | POA: Diagnosis not present

## 2021-02-23 DIAGNOSIS — M545 Low back pain, unspecified: Secondary | ICD-10-CM | POA: Diagnosis not present

## 2021-02-23 DIAGNOSIS — C4022 Malignant neoplasm of long bones of left lower limb: Secondary | ICD-10-CM | POA: Diagnosis not present

## 2021-02-23 DIAGNOSIS — M47812 Spondylosis without myelopathy or radiculopathy, cervical region: Secondary | ICD-10-CM | POA: Diagnosis not present

## 2021-02-23 DIAGNOSIS — I10 Essential (primary) hypertension: Secondary | ICD-10-CM | POA: Diagnosis not present

## 2021-02-23 DIAGNOSIS — K219 Gastro-esophageal reflux disease without esophagitis: Secondary | ICD-10-CM | POA: Diagnosis not present

## 2021-05-19 DIAGNOSIS — M1611 Unilateral primary osteoarthritis, right hip: Secondary | ICD-10-CM | POA: Diagnosis not present

## 2021-05-19 DIAGNOSIS — M25551 Pain in right hip: Secondary | ICD-10-CM | POA: Diagnosis not present

## 2021-05-26 DIAGNOSIS — Z9889 Other specified postprocedural states: Secondary | ICD-10-CM | POA: Diagnosis not present

## 2021-05-26 DIAGNOSIS — C4022 Malignant neoplasm of long bones of left lower limb: Secondary | ICD-10-CM | POA: Diagnosis not present

## 2021-05-26 DIAGNOSIS — R918 Other nonspecific abnormal finding of lung field: Secondary | ICD-10-CM | POA: Diagnosis not present

## 2021-07-28 NOTE — Patient Instructions (Addendum)
DUE TO COVID-19 ONLY ONE VISITOR IS ALLOWED TO COME WITH YOU AND STAY IN THE WAITING ROOM ONLY DURING PRE OP AND PROCEDURE.   **NO VISITORS ARE ALLOWED IN THE SHORT STAY AREA OR RECOVERY ROOM!!**  IF YOU WILL BE ADMITTED INTO THE HOSPITAL YOU ARE ALLOWED ONLY TWO SUPPORT PEOPLE DURING VISITATION HOURS ONLY (7 AM -8PM)    Up to two visitors ages 30+ are allowed at one in a patient's room.  The visitors may rotate out with other people throughout the day.  Additionally, up to two children between the ages of 1 and 18 are allowed and do not count toward the number of allowed visitors.  Children within this age range must be accompanied by an adult visitor.  One adult visitor may remain with the patient overnight and must be in the room by 8 PM.  COVID SWAB TESTING MUST BE COMPLETED ON:  08-08-21, Between the hours of 8 and 3  **MUST PRESENT COMPLETED FORM AT TESTING SITE**    [place] (backside of the building)  You are not required to quarantine, however you are required to wear a well-fitted mask when you are out and around people not in your household.  Hand Hygiene often Do NOT share personal items Notify your provider if you are in close contact with someone who has COVID or you develop fever 100.4 or greater, new onset of sneezing, cough, sore throat, shortness of breath or body aches.         Your procedure is scheduled on:  Wednesday, 08-10-21   Report to [facility] Main  Entrance    Report to admitting at 8:15 AM   Call this number if you have problems the morning of surgery [phone]   Do not eat food :After Midnight.   May have liquids until 8:00 AM day of surgery  CLEAR LIQUID DIET  Foods Allowed                                                                     Foods Excluded  Water, Black Coffee (no milk/no creamer) and tea, regular and decaf                              liquids that you cannot  Plain Jell-O in any flavor  (No  red)                         see through such as: Fruit ices (not with fruit pulp)                                 milk, soups, orange juice  Iced Popsicles (No red)                                    All solid food                             Apple juices Sports drinks  like Gatorade (No red) Lightly seasoned clear broth or consume(fat free) Sugar    Complete one Ensure drink the morning of surgery at  8:00 AM the day of surgery.       The day of surgery:  Drink ONE (1) Pre-Surgery Clear Ensure the morning of surgery. Drink in one sitting. Do not sip.  This drink was given to you during your hospital  pre-op appointment visit. Nothing else to drink after completing the Pre-Surgery Clear Ensure .          If you have questions, please contact your surgeon's office.     Oral Hygiene is also important to reduce your risk of infection.                                    Remember - BRUSH YOUR TEETH THE MORNING OF SURGERY WITH YOUR REGULAR TOOTHPASTE   Do NOT smoke after Midnight   Take these medicines the morning of surgery with A SIP OF WATER:  Acetaminophen, Pantoprazole, Tramadol   Aspirin - stop one week before surgery   Stop all vitamins and herbal supplements a week before surgery             You may not have any metal on your body including jewelry, and body piercing             Do not wear  lotions, powders, cologne, or deodorant              Men may shave face and neck.  Do not bring valuables to the hospital. [place].   Contacts, dentures or bridgework may not be worn into surgery.   Bring small overnight bag day of surgery.   Please read over the following fact sheets you were given: IF YOU HAVE QUESTIONS ABOUT YOUR PRE OP INSTRUCTIONS PLEASE CALL [facility] - Preparing for Surgery Before surgery, you can play an important role.  Because skin is not sterile, your skin needs to be as free of germs as  possible.  You can reduce the number of germs on your skin by washing with CHG (chlorahexidine gluconate) soap before surgery.  CHG is an antiseptic cleaner which kills germs and bonds with the skin to continue killing germs even after washing. Please DO NOT use if you have an allergy to CHG or antibacterial soaps.  If your skin becomes reddened/irritated stop using the CHG and inform your nurse when you arrive at Short Stay. Do not shave (including legs and underarms) for at least 48 hours prior to the first CHG shower.  You may shave your face/neck.  Please follow these instructions carefully:  1.  Shower with CHG Soap the night before surgery and the  morning of surgery.  2.  If you choose to wash your hair, wash your hair first as usual with your normal  shampoo.  3.  After you shampoo, rinse your hair and body thoroughly to remove the shampoo.                             4.  Use CHG as you would any other liquid soap.  You can apply chg directly to the skin and wash.  Gently with a scrungie or clean washcloth.  5.  Apply the CHG Soap to your body  ONLY FROM THE NECK DOWN.   Do   not use on face/ open                           Wound or open sores. Avoid contact with eyes, ears mouth and   genitals (private parts).                       Wash face,  Genitals (private parts) with your normal soap.             6.  Wash thoroughly, paying special attention to the area where your    surgery  will be performed.  7.  Thoroughly rinse your body with warm water from the neck down.  8.  DO NOT shower/wash with your normal soap after using and rinsing off the CHG Soap.                9.  Pat yourself dry with a clean towel.            10.  Wear clean pajamas.            11.  Place clean sheets on your bed the night of your first shower and do not  sleep with pets. Day of Surgery : Do not apply any lotions/deodorants the morning of surgery.  Please wear clean clothes to the hospital/surgery  center.  FAILURE TO FOLLOW THESE INSTRUCTIONS MAY RESULT IN THE CANCELLATION OF YOUR SURGERY  PATIENT SIGNATURE_________________________________  NURSE SIGNATURE__________________________________  ________________________________________________________________________   Adam Phenix  An incentive spirometer is a tool that can help keep your lungs clear and active. This tool measures how well you are filling your lungs with each breath. Taking long deep breaths may help reverse or decrease the chance of developing breathing (pulmonary) problems (especially infection) following: A long period of time when you are unable to move or be active. BEFORE THE PROCEDURE  If the spirometer includes an indicator to show your best effort, your nurse or respiratory therapist will set it to a desired goal. If possible, sit up straight or lean slightly forward. Try not to slouch. Hold the incentive spirometer in an upright position. INSTRUCTIONS FOR USE  Sit on the edge of your bed if possible, or sit up as far as you can in bed or on a chair. Hold the incentive spirometer in an upright position. Breathe out normally. Place the mouthpiece in your mouth and seal your lips tightly around it. Breathe in slowly and as deeply as possible, raising the piston or the ball toward the top of the column. Hold your breath for 3-5 seconds or for as long as possible. Allow the piston or ball to fall to the bottom of the column. Remove the mouthpiece from your mouth and breathe out normally. Rest for a few seconds and repeat Steps 1 through 7 at least 10 times every 1-2 hours when you are awake. Take your time and take a few normal breaths between deep breaths. The spirometer may include an indicator to show your best effort. Use the indicator as a goal to work toward during each repetition. After each set of 10 deep breaths, practice coughing to be sure your lungs are clear. If you have an incision (the cut  made at the time of surgery), support your incision when coughing by placing a pillow or rolled up towels firmly against it. Once you are able to get out  of bed, walk around indoors and cough well. You may stop using the incentive spirometer when instructed by your caregiver.  RISKS AND COMPLICATIONS Take your time so you do not get dizzy or light-headed. If you are in pain, you may need to take or ask for pain medication before doing incentive spirometry. It is harder to take a deep breath if you are having pain. AFTER USE Rest and breathe slowly and easily. It can be helpful to keep track of a log of your progress. Your caregiver can provide you with a simple table to help with this. If you are using the spirometer at home, follow these instructions: [place] IF:  You are having difficultly using the spirometer. You have trouble using the spirometer as often as instructed. Your pain medication is not giving enough relief while using the spirometer. You develop fever of 100.5 F (38.1 C) or higher. SEEK IMMEDIATE MEDICAL CARE IF:  You cough up bloody sputum that had not been present before. You develop fever of 102 F (38.9 C) or greater. You develop worsening pain at or near the incision site. MAKE SURE YOU:  Understand these instructions. Will watch your condition. Will get help right away if you are not doing well or get worse. Document Released: 02/19/2007 Document Revised: 01/01/2012 Document Reviewed: 04/22/2007 ExitCare Patient Information 2014 Exit[employer], [place].   ________________________________________________________________________  WHAT IS A BLOOD TRANSFUSION? Blood Transfusion Information  A transfusion is the replacement of blood or some of its parts. Blood is made up of multiple cells which provide different functions. Red blood cells carry oxygen and are used for blood loss replacement. White blood cells fight against infection. Platelets control  bleeding. Plasma helps clot blood. Other blood products are available for specialized needs, such as hemophilia or other clotting disorders. BEFORE THE TRANSFUSION  Who gives blood for transfusions?  Healthy volunteers who are fully evaluated to make sure their blood is safe. This is blood bank blood. Transfusion therapy is the safest it has ever been in the practice of medicine. Before blood is taken from a donor, a complete history is taken to make sure that person has no history of diseases nor engages in risky social behavior (examples are intravenous drug use or sexual activity with multiple partners). The donor's travel history is screened to minimize risk of transmitting infections, such as malaria. The donated blood is tested for signs of infectious diseases, such as HIV and hepatitis. The blood is then tested to be sure it is compatible with you in order to minimize the chance of a transfusion reaction. If you or a relative donates blood, this is often done in anticipation of surgery and is not appropriate for emergency situations. It takes many days to process the donated blood. RISKS AND COMPLICATIONS Although transfusion therapy is very safe and saves many lives, the main dangers of transfusion include:  Getting an infectious disease. Developing a transfusion reaction. This is an allergic reaction to something in the blood you were given. Every precaution is taken to prevent this. The decision to have a blood transfusion has been considered carefully by your caregiver before blood is given. Blood is not given unless the benefits outweigh the risks. AFTER THE TRANSFUSION Right after receiving a blood transfusion, you will usually feel much better and more energetic. This is especially true if your red blood cells have gotten low (anemic). The transfusion raises the level of the red blood cells which carry oxygen, and this usually causes an  energy increase. The nurse administering the  transfusion will monitor you carefully for complications. HOME CARE INSTRUCTIONS  No special instructions are needed after a transfusion. You may find your energy is better. Speak with your caregiver about any limitations on activity for underlying diseases you may have. SEEK MEDICAL CARE IF:  Your condition is not improving after your transfusion. You develop redness or irritation at the intravenous (IV) site. SEEK IMMEDIATE MEDICAL CARE IF:  Any of the following symptoms occur over the next 12 hours: Shaking chills. You have a temperature by mouth above 102 F (38.9 C), not controlled by medicine. Chest, back, or muscle pain. People around you feel you are not acting correctly or are confused. Shortness of breath or difficulty breathing. Dizziness and fainting. You get a rash or develop hives. You have a decrease in urine output. Your urine turns a dark color or changes to pink, red, or brown. Any of the following symptoms occur over the next 10 days: You have a temperature by mouth above 102 F (38.9 C), not controlled by medicine. Shortness of breath. Weakness after normal activity. The white part of the eye turns yellow (jaundice). You have a decrease in the amount of urine or are urinating less often. Your urine turns a dark color or changes to pink, red, or brown. Document Released: 10/06/2000 Document Revised: 01/01/2012 Document Reviewed: 05/25/2008 [facility] Patient Information 2014 [place], [place].  _______________________________________________________________________

## 2021-07-29 ENCOUNTER — Encounter (HOSPITAL_COMMUNITY): Payer: Medicare PPO

## 2021-08-02 ENCOUNTER — Other Ambulatory Visit: Payer: Self-pay

## 2021-08-02 ENCOUNTER — Encounter (HOSPITAL_COMMUNITY)
Admission: RE | Admit: 2021-08-02 | Discharge: 2021-08-02 | Disposition: A | Discharge status: Home / Self Care | Payer: Medicare PPO | Source: Ambulatory Visit | Attending: Orthopedic Surgery | Admitting: Orthopedic Surgery

## 2021-08-02 ENCOUNTER — Encounter (HOSPITAL_COMMUNITY): Payer: Self-pay

## 2021-08-02 DIAGNOSIS — Z01812 Encounter for preprocedural laboratory examination: Secondary | ICD-10-CM | POA: Insufficient documentation

## 2021-08-02 HISTORY — DX: Malignant neoplasm of long bones of left lower limb: C40.22

## 2021-08-02 HISTORY — DX: Prediabetes: R73.03

## 2021-08-02 LAB — COMPREHENSIVE METABOLIC PANEL
ALT: 21 U/L (ref 0–44)
AST: 27 U/L (ref 15–41)
Albumin: 3.9 g/dL (ref 3.5–5.0)
Alkaline Phosphatase: 38 U/L (ref 38–126)
Anion gap: 8 (ref 5–15)
BUN: 25 mg/dL — ABNORMAL HIGH (ref 8–23)
CO2: 26 mmol/L (ref 22–32)
Calcium: 9.4 mg/dL (ref 8.9–10.3)
Chloride: 102 mmol/L (ref 98–111)
Creatinine, Ser: 1.37 mg/dL — ABNORMAL HIGH (ref 0.61–1.24)
GFR, Estimated: 51 mL/min — ABNORMAL LOW (ref >60)
Glucose, Bld: 98 mg/dL (ref 70–99)
Potassium: 4.1 mmol/L (ref 3.5–5.1)
Sodium: 136 mmol/L (ref 135–145)
Total Bilirubin: 0.7 mg/dL (ref 0.3–1.2)
Total Protein: 7.7 g/dL (ref 6.5–8.1)

## 2021-08-02 LAB — CBC
HCT: 43.5 % (ref 39.0–52.0)
Hemoglobin: 14.3 g/dL (ref 13.0–17.0)
MCH: 30.9 pg (ref 26.0–34.0)
MCHC: 32.9 g/dL (ref 30.0–36.0)
MCV: 94 fL (ref 80.0–100.0)
Platelets: 230 10*3/uL (ref 150–400)
RBC: 4.63 MIL/uL (ref 4.22–5.81)
RDW: 14.6 % (ref 11.5–15.5)
WBC: 8.2 10*3/uL (ref 4.0–10.5)
nRBC: 0 % (ref 0.0–0.2)

## 2021-08-02 LAB — HEMOGLOBIN A1C
Hgb A1c MFr Bld: 6.3 % — ABNORMAL HIGH (ref 4.8–5.6)
Mean Plasma Glucose: 134.11 mg/dL

## 2021-08-02 LAB — GLUCOSE, CAPILLARY: Glucose-Capillary: 100 mg/dL — ABNORMAL HIGH (ref 70–99)

## 2021-08-02 LAB — PROTIME-INR
INR: 1 (ref 0.8–1.2)
Prothrombin Time: 13.3 seconds (ref 11.4–15.2)

## 2021-08-02 LAB — SURGICAL PCR SCREEN
MRSA, PCR: NEGATIVE
Staphylococcus aureus: POSITIVE — AB

## 2021-08-02 NOTE — Progress Notes (Addendum)
COVID swab appointment: 08-08-21  COVID Vaccine Completed: Yes x4  Date COVID Vaccine completed: 11-27-19, 12-18-19 Has received booster: 08-01-20, 03-08-21 COVID vaccine manufacturer: Pfizer      Date of COVID positive in last 90 days:  n/a  PCP - Maury Dus, MD Cardiologist - N/A  Medical clearance on chart dated 06-01-21 by Dr. Alyson Ingles  Chest x-ray - 05-26-21 Care Everywhere EKG - 09-20-20 Epic Stress Test - greater than 2 years ECHO - N/A Cardiac Cath - N/A Pacemaker/ICD device last checked: Spinal Cord Stimulator:  Sleep Study - N/A CPAP -   Fasting Blood Sugar - 100 to 125 Checks Blood Sugar - 2 to 3 times a week   Blood Thinner Instructions: Aspirin Instructions:  ASA 81 mg. To stop one week prior per patient Last Dose:  Activity level:  Can go up a flight of stairs and perform activities of daily living without stopping and without symptoms of chest pain or shortness of breath.  Anesthesia review: N/A  Patient denies shortness of breath, fever, cough and chest pain at PAT appointment   Patient verbalized understanding of instructions that were given to them at the PAT appointment. Patient was also instructed that they will need to review over the PAT instructions again at home before surgery.

## 2021-08-03 NOTE — Progress Notes (Signed)
PCR results sent to Dr. Aluisio to review. 

## 2021-08-08 ENCOUNTER — Other Ambulatory Visit: Payer: Self-pay | Admitting: Orthopedic Surgery

## 2021-08-09 LAB — SARS CORONAVIRUS 2 (TAT 6-24 HRS): SARS Coronavirus 2: NEGATIVE

## 2021-08-09 NOTE — H&P (Signed)
TOTAL HIP ADMISSION H&P  Patient is admitted for right total hip arthroplasty.  Subjective:  Chief Complaint: Right hip pain  HPI: Kevin Mcintyre, 87 y.o. male, has a history of pain and functional disability in the right hip due to arthritis and patient has failed non-surgical conservative treatments for greater than 12 weeks to include NSAID's and/or analgesics, flexibility and strengthening excercises, and activity modification. Onset of symptoms was gradual, starting  several  years ago with gradually worsening course since that time. The patient noted no past surgery on the right hip. Patient currently rates pain in the right hip at 7 out of 10 with activity. Patient has worsening of pain with activity and weight bearing, pain that interfers with activities of daily living, pain with passive range of motion, and crepitus. Patient has evidence of periarticular osteophytes and joint space narrowing by imaging studies. This condition presents safety issues increasing the risk of falls. There is no current active infection.  Patient Active Problem List   Diagnosis Date Noted   HNP (herniated nucleus pulposus), cervical 12/23/2015   Acute upper respiratory infection 10/29/2014   HTN (hypertension) 11/01/2012    Past Medical History:  Diagnosis Date   Arthritis    hip- R- Feels a 'little catch in his hip"   Back problem    Chondrosarcoma of femur, left (HCC)    GERD (gastroesophageal reflux disease)    H/O exercise stress test 10/23/2010   pt. told that it was wnl   Hypertension    Pre-diabetes     Past Surgical History:  Procedure Laterality Date   ANTERIOR CERVICAL DECOMP/DISCECTOMY FUSION N/A 12/23/2015   Procedure: ANTERIOR CERVICAL DECOMPRESSION/DISCECTOMY FUSION CERVICAL FOUR-FIVE;  Surgeon: Jovita Gamma, MD;  Location: [facility];  Service: Neurosurgery;  Laterality: N/A;   BACK SURGERY     x3 lumbar surgeries, X1 cervical fusion as well   CERVICAL FUSION      CHOLECYSTECTOMY     HERNIA REPAIR Left 1990's   inguinal    INGUINAL HERNIA REPAIR Right 09/21/2020   Procedure: RIGHT INGUINAL HERNIA REPAIR WITH MESH;  Surgeon: Donnie Mesa, MD;  Location: [facility];  Service: General;  Laterality: Right;   INSERTION OF MESH Right 09/21/2020   Procedure: INSERTION OF MESH;  Surgeon: Donnie Mesa, MD;  Location: [facility];  Service: General;  Laterality: Right;   [place] N/A 09/21/2020   Procedure: UMBILICAL HERNIA;  Surgeon: Donnie Mesa, MD;  Location: [facility];  Service: General;  Laterality: N/A;    Prior to Admission medications   Medication Sig Start Date End Date Taking? Authorizing Provider  acetaminophen (TYLENOL) 325 MG tablet Take 650 mg by mouth every 6 (six) hours as needed for moderate pain.   Yes [provider]  aspirin 81 MG tablet Take 81 mg by mouth daily.   Yes [provider]  Cholecalciferol (VITAMIN D) 50 MCG (2000 UT) tablet Take 2,000 Units by mouth daily.   Yes [provider]  docusate sodium (COLACE) 100 MG capsule Take 200 mg by mouth daily.   Yes [provider]  famotidine (PEPCID) 40 MG tablet Take 40 mg by mouth at bedtime. 05/04/21  Yes [provider]  fluticasone (FLONASE) 50 MCG/ACT nasal spray Place 2 sprays into both nostrils daily. Patient taking differently: Place 2 sprays into both nostrils at bedtime. 06/18/15  Yes English, Colletta [place] D, PA  losartan-hydrochlorothiazide (HYZAAR) 50-12.5 MG tablet  Take 1 tablet by mouth daily.   Yes [provider]  Menthol, Topical Analgesic, (BIOFREEZE EX) Apply 1 application topically daily as needed (pain).   Yes [provider]  Oxymetazoline HCl (VICKS SINEX SEVERE NA) Place 2 sprays into the nose daily as needed (congestion).   Yes [provider]  pantoprazole (PROTONIX) 40 MG tablet Take 40 mg by mouth 2 (two) times  daily.    Yes [provider]  Phenylephrine-DM-GG-APAP (MUCINEX FAST-MAX COLD/FLU PO) Take 1 Dose by mouth daily as needed (cold symptoms).   Yes [provider]  traMADol (ULTRAM) 50 MG tablet Take 100 mg by mouth 2 (two) times daily as needed for moderate pain.   Yes [provider]    Allergies  Allergen Reactions   Shellfish Allergy Nausea Only    Shrimp - ok    Social History   Socioeconomic History   Marital status: Married    Spouse name: Not on file   Number of children: Not on file   Years of education: Not on file   Highest education level: Not on file  Occupational History   Not on file  Tobacco Use   Smoking status: Never   Smokeless tobacco: Never  Vaping Use   Vaping Use: Never used  Substance and Sexual Activity   Alcohol use: No   Drug use: No   Sexual activity: Not on file  Other Topics Concern   Not on file  Social History Narrative   Not on file   Social Determinants of Health   Financial Resource Strain: Not on file  Food Insecurity: Not on file  Transportation Needs: Not on file  Physical Activity: Not on file  Stress: Not on file  Social Connections: Not on file  Intimate Partner Violence: Not on file    Tobacco Use: Low Risk    Smoking Tobacco Use: Never   Smokeless Tobacco Use: Never   Social History   Substance and Sexual Activity  Alcohol Use No    No family history on file.  ROS: Constitutional: no fever, no chills, no night sweats, no significant weight loss Cardiovascular: no chest pain, no palpitations Respiratory: no cough, no shortness of breath, No COPD Gastrointestinal: no vomiting, no nausea Musculoskeletal: no swelling in Joints, Joint Pain Neurologic: no numbness, no tingling, no difficulty with balance    Objective:  Physical Exam: Well nourished and well developed.  General: Alert and oriented x3, cooperative and pleasant, no acute distress.  Head: normocephalic, atraumatic,  neck supple.  Eyes: EOMI.  Respiratory: breath sounds clear in all fields, no wheezing, rales, or rhonchi. Cardiovascular: Regular rate and rhythm, no murmurs, gallops or rubs.  Abdomen: non-tender to palpation and soft, normoactive bowel sounds. Musculoskeletal:  The patient has a significantly antalgic gait pattern favoring the right side with the use of a cane.     Right Hip Exam:   Range of motion: Flexion to 100 degrees, internal rotation to 0 degrees, external rotation to 20 degrees, and abduction to 20 degrees without discomfort.   There is no tenderness over the greater trochanteric bursa.     Left Hip Exam:   Range of motion: Flexion to 110 degrees, internal rotation to 20 degrees, external rotation to 30 degrees, and abduction to 30 degrees without discomfort..   Calves soft and nontender. Motor function intact in LE. Strength 5/5 LE bilaterally. Neuro: Distal pulses 2+. Sensation to light touch intact in LE.    Vital signs in last  24 hours:    Imaging Review Radiographs - AP pelvis, AP and lateral of the right hip dated 12/02/2020 demonstrate severe bone-on-bone arthritis in the right hip with subchondral cysts and marginal osteophytes. The left hip shows a proximal femoral replacement from the previous tumor. The prosthesis is in good position with no periprosthetic abnormalities.  Assessment/Plan:  End stage arthritis, right hip  The patient history, physical examination, clinical judgement of the provider and imaging studies are consistent with end stage degenerative joint disease of the right hip and total hip arthroplasty is deemed medically necessary. The treatment options including medical management, injection therapy, arthroscopy and arthroplasty were discussed at length. The risks and benefits of total hip arthroplasty were presented and reviewed. The risks due to aseptic loosening, infection, stiffness, dislocation/subluxation, thromboembolic complications and  other imponderables were discussed. The patient acknowledged the explanation, agreed to proceed with the plan and consent was signed. Patient is being admitted for inpatient treatment for surgery, pain control, PT, OT, prophylactic antibiotics, VTE prophylaxis, progressive ambulation and ADLs and discharge planning.The patient is planning to be discharged  home .   Patient's anticipated LOS is less than 2 midnights, meeting these requirements: - Lives within 1 hour of care - Has a competent adult at home to recover with post-op  - NO history of  - Chronic pain requiring opioids  - Diabetes  - Coronary Artery Disease  - Heart failure  - Heart attack  - Stroke  - DVT/VTE  - Cardiac arrhythmia  - Respiratory Failure/COPD  - Renal failure  - Anemia  - Advanced Liver disease  Therapy Plans: HEP Disposition: Home with Wife Planned DVT Prophylaxis: Xarelto 10mg  (Hx of Cancer) DME Needed: None PCP: Clearance received TXA: IV Allergies: None Anesthesia Concerns: N/A BMI: 26.8 Last HgbA1c: N/A  Pharm[facility]: [facility] [address]    - Patient was instructed on what medications to stop prior to surgery. - Follow-up visit in 2 weeks with Dr. Wynelle Link - Begin physical therapy following surgery - Pre-operative lab work as pre-surgical testing - Prescriptions will be provided in hospital at time of discharge  Fenton Foy, [facility], PA-C Orthopedic Surgery [facility] [place]

## 2021-08-10 ENCOUNTER — Ambulatory Visit (HOSPITAL_COMMUNITY): Payer: Medicare PPO | Admitting: Certified Registered Nurse Anesthetist

## 2021-08-10 ENCOUNTER — Encounter (HOSPITAL_COMMUNITY): Payer: Self-pay | Admitting: Orthopedic Surgery

## 2021-08-10 ENCOUNTER — Observation Stay (HOSPITAL_COMMUNITY): Payer: Medicare PPO

## 2021-08-10 ENCOUNTER — Observation Stay (HOSPITAL_COMMUNITY)
Admission: RE | Admit: 2021-08-10 | Discharge: 2021-08-11 | Disposition: A | Discharge status: Home / Self Care | Payer: Medicare PPO | Attending: Orthopedic Surgery | Admitting: Orthopedic Surgery

## 2021-08-10 ENCOUNTER — Encounter (HOSPITAL_COMMUNITY)
Admission: RE | Disposition: A | Discharge status: Home / Self Care | Payer: Self-pay | Source: Home / Self Care | Attending: Orthopedic Surgery

## 2021-08-10 ENCOUNTER — Ambulatory Visit (HOSPITAL_COMMUNITY): Payer: Medicare PPO

## 2021-08-10 DIAGNOSIS — Z96649 Presence of unspecified artificial hip joint: Secondary | ICD-10-CM

## 2021-08-10 DIAGNOSIS — Z7982 Long term (current) use of aspirin: Secondary | ICD-10-CM | POA: Insufficient documentation

## 2021-08-10 DIAGNOSIS — M169 Osteoarthritis of hip, unspecified: Secondary | ICD-10-CM

## 2021-08-10 DIAGNOSIS — I10 Essential (primary) hypertension: Secondary | ICD-10-CM | POA: Insufficient documentation

## 2021-08-10 DIAGNOSIS — M1611 Unilateral primary osteoarthritis, right hip: Principal | ICD-10-CM | POA: Diagnosis present

## 2021-08-10 DIAGNOSIS — Z471 Aftercare following joint replacement surgery: Secondary | ICD-10-CM | POA: Diagnosis not present

## 2021-08-10 DIAGNOSIS — R7303 Prediabetes: Secondary | ICD-10-CM | POA: Insufficient documentation

## 2021-08-10 DIAGNOSIS — K219 Gastro-esophageal reflux disease without esophagitis: Secondary | ICD-10-CM | POA: Diagnosis not present

## 2021-08-10 DIAGNOSIS — Z79899 Other long term (current) drug therapy: Secondary | ICD-10-CM | POA: Diagnosis not present

## 2021-08-10 DIAGNOSIS — Z96641 Presence of right artificial hip joint: Secondary | ICD-10-CM | POA: Diagnosis not present

## 2021-08-10 DIAGNOSIS — Z419 Encounter for procedure for purposes other than remedying health state, unspecified: Secondary | ICD-10-CM

## 2021-08-10 HISTORY — PX: TOTAL HIP ARTHROPLASTY: SHX124

## 2021-08-10 LAB — TYPE AND SCREEN
ABO/RH(D): O POS
Antibody Screen: NEGATIVE

## 2021-08-10 LAB — ABO/RH: ABO/RH(D): O POS

## 2021-08-10 LAB — GLUCOSE, CAPILLARY: Glucose-Capillary: 108 mg/dL — ABNORMAL HIGH (ref 70–99)

## 2021-08-10 SURGERY — ARTHROPLASTY, HIP, TOTAL, ANTERIOR APPROACH
Anesthesia: Spinal | Site: Hip | Laterality: Right

## 2021-08-10 MED ORDER — PHENYLEPHRINE HCL-NACL 20-0.9 MG/250ML-% IV SOLN
INTRAVENOUS | Status: DC | PRN
  Administered 2021-08-10: 30 ug/min via INTRAVENOUS

## 2021-08-10 MED ORDER — FAMOTIDINE 20 MG PO TABS
40.0000 mg | ORAL_TABLET | Freq: Every day | ORAL | Status: DC
  Administered 2021-08-10: 40 mg via ORAL
  Filled 2021-08-10: qty 2

## 2021-08-10 MED ORDER — TRAMADOL HCL 50 MG PO TABS
50.0000 mg | ORAL_TABLET | Freq: Four times a day (QID) | ORAL | Status: DC | PRN
  Administered 2021-08-11: 50 mg via ORAL
  Filled 2021-08-10: qty 1

## 2021-08-10 MED ORDER — WATER FOR IRRIGATION, STERILE IR SOLN
Status: DC | PRN
  Administered 2021-08-10: 2000 mL

## 2021-08-10 MED ORDER — 0.9 % SODIUM CHLORIDE (POUR BTL) OPTIME
TOPICAL | Status: DC | PRN
  Administered 2021-08-10: 1000 mL

## 2021-08-10 MED ORDER — MENTHOL 3 MG MT LOZG: 1.0000 | LOZENGE | OROMUCOSAL | Status: DC | PRN

## 2021-08-10 MED ORDER — METHOCARBAMOL 500 MG PO TABS
500.0000 mg | ORAL_TABLET | Freq: Four times a day (QID) | ORAL | Status: DC | PRN
  Administered 2021-08-11: 500 mg via ORAL
  Filled 2021-08-10 (×2): qty 1

## 2021-08-10 MED ORDER — ONDANSETRON HCL 4 MG/2ML IJ SOLN: 4.0000 mg | Freq: Four times a day (QID) | INTRAMUSCULAR | Status: DC | PRN

## 2021-08-10 MED ORDER — RIVAROXABAN 10 MG PO TABS
10.0000 mg | ORAL_TABLET | Freq: Every day | ORAL | Status: DC
  Administered 2021-08-11: 10 mg via ORAL
  Filled 2021-08-10: qty 1

## 2021-08-10 MED ORDER — PHENOL 1.4 % MT LIQD: 1.0000 | OROMUCOSAL | Status: DC | PRN

## 2021-08-10 MED ORDER — DOCUSATE SODIUM 100 MG PO CAPS
100.0000 mg | ORAL_CAPSULE | Freq: Two times a day (BID) | ORAL | Status: DC
  Administered 2021-08-10 – 2021-08-11 (×2): 100 mg via ORAL
  Filled 2021-08-10 (×2): qty 1

## 2021-08-10 MED ORDER — PROPOFOL 10 MG/ML IV BOLUS
INTRAVENOUS | Status: DC | PRN
  Administered 2021-08-10: 25 mg via INTRAVENOUS

## 2021-08-10 MED ORDER — ONDANSETRON HCL 4 MG/2ML IJ SOLN: 4.0000 mg | Freq: Once | INTRAMUSCULAR | Status: DC | PRN

## 2021-08-10 MED ORDER — PHENYLEPHRINE HCL-NACL 20-0.9 MG/250ML-% IV SOLN
INTRAVENOUS | Status: AC
  Filled 2021-08-10: qty 750

## 2021-08-10 MED ORDER — ONDANSETRON HCL 4 MG PO TABS: 4.0000 mg | ORAL_TABLET | Freq: Four times a day (QID) | ORAL | Status: DC | PRN

## 2021-08-10 MED ORDER — BUPIVACAINE IN DEXTROSE 0.75-8.25 % IT SOLN
INTRATHECAL | Status: DC | PRN
  Administered 2021-08-10: 1.6 mL via INTRATHECAL

## 2021-08-10 MED ORDER — PROPOFOL 1000 MG/100ML IV EMUL
INTRAVENOUS | Status: AC
  Filled 2021-08-10: qty 100

## 2021-08-10 MED ORDER — DEXAMETHASONE SODIUM PHOSPHATE 10 MG/ML IJ SOLN
10.0000 mg | Freq: Once | INTRAMUSCULAR | Status: AC
  Administered 2021-08-11: 10 mg via INTRAVENOUS
  Filled 2021-08-10: qty 1

## 2021-08-10 MED ORDER — PROPOFOL 500 MG/50ML IV EMUL
INTRAVENOUS | Status: DC | PRN
  Administered 2021-08-10: 75 ug/kg/min via INTRAVENOUS

## 2021-08-10 MED ORDER — FENTANYL CITRATE (PF) 100 MCG/2ML IJ SOLN
INTRAMUSCULAR | Status: DC | PRN
  Administered 2021-08-10: 25 ug via INTRAVENOUS
  Administered 2021-08-10: 50 ug via INTRAVENOUS
  Administered 2021-08-10: 25 ug via INTRAVENOUS

## 2021-08-10 MED ORDER — LACTATED RINGERS IV SOLN: INTRAVENOUS | Status: DC

## 2021-08-10 MED ORDER — CEFAZOLIN SODIUM-DEXTROSE 2-4 GM/100ML-% IV SOLN
2.0000 g | INTRAVENOUS | Status: AC
  Administered 2021-08-10: 2 g via INTRAVENOUS
  Filled 2021-08-10: qty 100

## 2021-08-10 MED ORDER — HYDROCODONE-ACETAMINOPHEN 5-325 MG PO TABS
1.0000 | ORAL_TABLET | ORAL | Status: DC | PRN
  Administered 2021-08-10: 1 via ORAL
  Administered 2021-08-11 (×2): 2 via ORAL
  Filled 2021-08-10: qty 1
  Filled 2021-08-10 (×3): qty 2

## 2021-08-10 MED ORDER — BUPIVACAINE HCL 0.25 % IJ SOLN
INTRAMUSCULAR | Status: AC
  Filled 2021-08-10: qty 1

## 2021-08-10 MED ORDER — FENTANYL CITRATE PF 50 MCG/ML IJ SOSY: 25.0000 ug | PREFILLED_SYRINGE | INTRAMUSCULAR | Status: DC | PRN

## 2021-08-10 MED ORDER — LOSARTAN POTASSIUM-HCTZ 50-12.5 MG PO TABS: 1.0000 | ORAL_TABLET | Freq: Every day | ORAL | Status: DC

## 2021-08-10 MED ORDER — METOCLOPRAMIDE HCL 5 MG/ML IJ SOLN: 5.0000 mg | Freq: Three times a day (TID) | INTRAMUSCULAR | Status: DC | PRN

## 2021-08-10 MED ORDER — ONDANSETRON HCL 4 MG/2ML IJ SOLN
INTRAMUSCULAR | Status: DC | PRN
  Administered 2021-08-10: 4 mg via INTRAVENOUS

## 2021-08-10 MED ORDER — METHOCARBAMOL 500 MG IVPB - SIMPLE MED
500.0000 mg | Freq: Four times a day (QID) | INTRAVENOUS | Status: DC | PRN
  Administered 2021-08-10: 500 mg via INTRAVENOUS
  Filled 2021-08-10: qty 500
  Filled 2021-08-10: qty 50

## 2021-08-10 MED ORDER — HYDROCODONE-ACETAMINOPHEN 7.5-325 MG PO TABS
1.0000 | ORAL_TABLET | ORAL | Status: DC | PRN
  Administered 2021-08-10: 1 via ORAL
  Filled 2021-08-10: qty 1

## 2021-08-10 MED ORDER — CEFAZOLIN SODIUM-DEXTROSE 2-4 GM/100ML-% IV SOLN
2.0000 g | Freq: Four times a day (QID) | INTRAVENOUS | Status: AC
  Administered 2021-08-10 (×2): 2 g via INTRAVENOUS
  Filled 2021-08-10 (×2): qty 100

## 2021-08-10 MED ORDER — FENTANYL CITRATE (PF) 100 MCG/2ML IJ SOLN
INTRAMUSCULAR | Status: AC
  Filled 2021-08-10: qty 2

## 2021-08-10 MED ORDER — ACETAMINOPHEN 325 MG PO TABS: 325.0000 mg | ORAL_TABLET | Freq: Four times a day (QID) | ORAL | Status: DC | PRN

## 2021-08-10 MED ORDER — DEXAMETHASONE SODIUM PHOSPHATE 10 MG/ML IJ SOLN
8.0000 mg | Freq: Once | INTRAMUSCULAR | Status: AC
  Administered 2021-08-10: 8 mg via INTRAVENOUS

## 2021-08-10 MED ORDER — POLYETHYLENE GLYCOL 3350 17 G PO PACK: 17.0000 g | PACK | Freq: Every day | ORAL | Status: DC | PRN

## 2021-08-10 MED ORDER — EPHEDRINE SULFATE-NACL 50-0.9 MG/10ML-% IV SOSY
PREFILLED_SYRINGE | INTRAVENOUS | Status: DC | PRN
  Administered 2021-08-10: 10 mg via INTRAVENOUS
  Administered 2021-08-10: 5 mg via INTRAVENOUS

## 2021-08-10 MED ORDER — ONDANSETRON HCL 4 MG/2ML IJ SOLN
INTRAMUSCULAR | Status: AC
  Filled 2021-08-10: qty 2

## 2021-08-10 MED ORDER — MORPHINE SULFATE (PF) 2 MG/ML IV SOLN: 0.5000 mg | INTRAVENOUS | Status: DC | PRN

## 2021-08-10 MED ORDER — HYDROCHLOROTHIAZIDE 12.5 MG PO TABS
12.5000 mg | ORAL_TABLET | Freq: Every day | ORAL | Status: DC
  Filled 2021-08-10: qty 1

## 2021-08-10 MED ORDER — POVIDONE-IODINE 10 % EX SWAB
2.0000 "application " | Freq: Once | CUTANEOUS | Status: AC
  Administered 2021-08-10: 2 via TOPICAL

## 2021-08-10 MED ORDER — ACETAMINOPHEN 10 MG/ML IV SOLN
1000.0000 mg | Freq: Four times a day (QID) | INTRAVENOUS | Status: DC
  Administered 2021-08-10: 1000 mg via INTRAVENOUS
  Filled 2021-08-10: qty 100

## 2021-08-10 MED ORDER — LOSARTAN POTASSIUM 50 MG PO TABS
50.0000 mg | ORAL_TABLET | Freq: Every day | ORAL | Status: DC
  Filled 2021-08-10: qty 1

## 2021-08-10 MED ORDER — PANTOPRAZOLE SODIUM 40 MG PO TBEC
40.0000 mg | DELAYED_RELEASE_TABLET | Freq: Two times a day (BID) | ORAL | Status: DC
  Administered 2021-08-11: 40 mg via ORAL
  Filled 2021-08-10: qty 1

## 2021-08-10 MED ORDER — EPHEDRINE 5 MG/ML INJ
INTRAVENOUS | Status: AC
  Filled 2021-08-10: qty 5

## 2021-08-10 MED ORDER — BISACODYL 10 MG RE SUPP: 10.0000 mg | Freq: Every day | RECTAL | Status: DC | PRN

## 2021-08-10 MED ORDER — ORAL CARE MOUTH RINSE: 15.0000 mL | Freq: Once | OROMUCOSAL | Status: AC

## 2021-08-10 MED ORDER — DEXAMETHASONE SODIUM PHOSPHATE 10 MG/ML IJ SOLN
INTRAMUSCULAR | Status: AC
  Filled 2021-08-10: qty 1

## 2021-08-10 MED ORDER — SODIUM CHLORIDE 0.9 % IV SOLN: INTRAVENOUS | Status: DC

## 2021-08-10 MED ORDER — TRANEXAMIC ACID-NACL 1000-0.7 MG/100ML-% IV SOLN
1000.0000 mg | INTRAVENOUS | Status: AC
  Administered 2021-08-10: 1000 mg via INTRAVENOUS
  Filled 2021-08-10: qty 100

## 2021-08-10 MED ORDER — BUPIVACAINE HCL 0.25 % IJ SOLN
INTRAMUSCULAR | Status: DC | PRN
  Administered 2021-08-10: 30 mL

## 2021-08-10 MED ORDER — CHLORHEXIDINE GLUCONATE 0.12 % MT SOLN
15.0000 mL | Freq: Once | OROMUCOSAL | Status: AC
  Administered 2021-08-10: 15 mL via OROMUCOSAL

## 2021-08-10 MED ORDER — METOCLOPRAMIDE HCL 5 MG PO TABS: 5.0000 mg | ORAL_TABLET | Freq: Three times a day (TID) | ORAL | Status: DC | PRN

## 2021-08-10 SURGICAL SUPPLY — 46 items
ARTICULEZE HEAD (Hips) ×2 IMPLANT
BAG COUNTER SPONGE SURGICOUNT (BAG) IMPLANT
BAG DECANTER FOR FLEXI CONT (MISCELLANEOUS) IMPLANT
BAG SPEC THK2 15X12 ZIP CLS (MISCELLANEOUS)
BAG SPNG CNTER NS LX DISP (BAG)
BAG ZIPLOCK 12X15 (MISCELLANEOUS) IMPLANT
BLADE SAG 18X100X1.27 (BLADE) ×2 IMPLANT
CLSR STERI-STRIP ANTIMIC 1/2X4 (GAUZE/BANDAGES/DRESSINGS) ×1 IMPLANT
COVER PERINEAL POST (MISCELLANEOUS) ×2 IMPLANT
COVER SURGICAL LIGHT HANDLE (MISCELLANEOUS) ×2 IMPLANT
CUP ACETBLR 54 OD PINNACLE (Hips) ×1 IMPLANT
DECANTER SPIKE VIAL GLASS SM (MISCELLANEOUS) ×2 IMPLANT
DRAPE FOOT SWITCH (DRAPES) ×2 IMPLANT
DRAPE STERI IOBAN 125X83 (DRAPES) ×2 IMPLANT
DRAPE U-SHAPE 47X51 STRL (DRAPES) ×4 IMPLANT
DRESSING AQUACEL AG SP 3.5X10 (GAUZE/BANDAGES/DRESSINGS) IMPLANT
DRSG AQUACEL AG ADV 3.5X10 (GAUZE/BANDAGES/DRESSINGS) ×2 IMPLANT
DRSG AQUACEL AG SP 3.5X10 (GAUZE/BANDAGES/DRESSINGS) ×2
DURAPREP 26ML APPLICATOR (WOUND CARE) ×2 IMPLANT
ELECT REM PT RETURN 15FT ADLT (MISCELLANEOUS) ×2 IMPLANT
GLOVE SRG 8 PF TXTR STRL LF DI (GLOVE) ×1 IMPLANT
GLOVE SURG ENC MOIS LTX SZ6.5 (GLOVE) ×2 IMPLANT
GLOVE SURG ENC MOIS LTX SZ7 (GLOVE) ×2 IMPLANT
GLOVE SURG ENC MOIS LTX SZ8 (GLOVE) ×4 IMPLANT
GLOVE SURG UNDER POLY LF SZ7 (GLOVE) ×2 IMPLANT
GLOVE SURG UNDER POLY LF SZ8 (GLOVE) ×2
GLOVE SURG UNDER POLY LF SZ8.5 (GLOVE) IMPLANT
GOWN STRL REUS W/TWL LRG LVL3 (GOWN DISPOSABLE) ×4 IMPLANT
GOWN STRL REUS W/TWL XL LVL3 (GOWN DISPOSABLE) IMPLANT
HEAD ARTICULEZE (Hips) IMPLANT
HOLDER FOLEY CATH W/STRAP (MISCELLANEOUS) ×2 IMPLANT
LINER MARATHON NEUT +4X54X36 (Hips) ×1 IMPLANT
MANIFOLD NEPTUNE II (INSTRUMENTS) ×2 IMPLANT
PACK ANTERIOR HIP CUSTOM (KITS) ×2 IMPLANT
PENCIL SMOKE EVACUATOR COATED (MISCELLANEOUS) ×2 IMPLANT
STEM FEMORAL SZ5 HIGH ACTIS (Stem) ×1 IMPLANT
STRIP CLOSURE SKIN 1/2X4 (GAUZE/BANDAGES/DRESSINGS) ×2 IMPLANT
SUT ETHIBOND NAB CT1 #1 30IN (SUTURE) ×2 IMPLANT
SUT MNCRL AB 4-0 PS2 18 (SUTURE) ×2 IMPLANT
SUT STRATAFIX 0 PDS 27 VIOLET (SUTURE) ×2
SUT VIC AB 2-0 CT1 27 (SUTURE) ×4
SUT VIC AB 2-0 CT1 TAPERPNT 27 (SUTURE) ×2 IMPLANT
SUTURE STRATFX 0 PDS 27 VIOLET (SUTURE) ×1 IMPLANT
SYR 50ML LL SCALE MARK (SYRINGE) IMPLANT
TRAY FOLEY MTR SLVR 16FR STAT (SET/KITS/TRAYS/PACK) ×2 IMPLANT
TUBE SUCTION HIGH CAP CLEAR NV (SUCTIONS) ×2 IMPLANT

## 2021-08-10 NOTE — Anesthesia Preprocedure Evaluation (Signed)
Anesthesia Evaluation  Patient identified by MRN, date of birth, ID band Patient awake    Reviewed: Allergy & Precautions, NPO status , Patient's Chart, lab work & pertinent test results  Airway Mallampati: I       Dental  (+) Edentulous Upper, Edentulous Lower   Pulmonary neg pulmonary ROS,    Pulmonary exam normal        Cardiovascular hypertension, Pt. on medications Normal cardiovascular exam     Neuro/Psych negative neurological ROS  negative psych ROS   GI/Hepatic Neg liver ROS, GERD  Medicated,  Endo/Other  negative endocrine ROS  Renal/GU Renal InsufficiencyRenal disease  negative genitourinary   Musculoskeletal  (+) Arthritis , Osteoarthritis,    Abdominal Normal abdominal exam  (+)   Peds  Hematology negative hematology ROS (+)   Anesthesia Other Findings   Reproductive/Obstetrics                             Anesthesia Physical Anesthesia Plan  ASA: 2  Anesthesia Plan: Spinal   Post-op Pain Management:    Induction:   PONV Risk Score and Plan: 1 and Ondansetron  Airway Management Planned: Natural Airway and Simple Face Mask  Additional Equipment: None  Intra-op Plan:   Post-operative Plan:   Informed Consent: I have reviewed the patients History and Physical, chart, labs and discussed the procedure including the risks, benefits and alternatives for the proposed anesthesia with the patient or authorized representative who has indicated his/her understanding and acceptance.       Plan Discussed with: CRNA  Anesthesia Plan Comments:         Anesthesia Quick Evaluation

## 2021-08-10 NOTE — Anesthesia Procedure Notes (Signed)
Spinal  Patient location during procedure: OR Start time: 08/10/2021 9:50 AM End time: 08/10/2021 9:55 AM Reason for block: surgical anesthesia Staffing Performed: anesthesiologist  Anesthesiologist: Lyn Hollingshead, MD Preanesthetic Checklist Completed: patient identified, IV checked, site marked, risks and benefits discussed, surgical consent, monitors and equipment checked, pre-op evaluation and timeout performed Spinal Block Patient position: sitting Prep: DuraPrep and site prepped and draped Patient monitoring: continuous pulse ox and blood pressure Approach: midline Location: L3-4 Injection technique: single-shot Needle Needle type: Pencan  Needle gauge: 24 G Needle length: 10 cm Needle insertion depth: 6 cm Assessment Sensory level: T8 Events: CSF return

## 2021-08-10 NOTE — Discharge Instructions (Addendum)
Kevin Arabian, MD Total Joint Specialist [facility] [address], [address] [place], [place] [address] [phone]  ANTERIOR APPROACH TOTAL HIP REPLACEMENT POSTOPERATIVE DIRECTIONS     Hip Rehabilitation, Guidelines Following Surgery  The results of a hip operation are greatly improved after range of motion and muscle strengthening exercises. Follow all safety measures which are given to protect your hip. If any of these exercises cause increased pain or swelling in your joint, decrease the amount until you are comfortable again. Then slowly increase the exercises. Call your caregiver if you have problems or questions.   BLOOD CLOT PREVENTION Take a 10 mg Xarelto once a day for three weeks following surgery. Then resume one 81 mg aspirin once a day. You may resume your vitamins/supplements once you have discontinued the Xarelto. Do not take any NSAIDs (Advil, Aleve, Ibuprofen, Meloxicam, etc.) until you have discontinued the Xarelto.   HOME CARE INSTRUCTIONS  Remove items at home which could result in a fall. This includes throw rugs or furniture in walking pathways.  ICE to the affected hip as frequently as 20-30 minutes an hour and then as needed for pain and swelling. Continue to use ice on the hip for pain and swelling from surgery. You may notice swelling that will progress down to the foot and ankle. This is normal after surgery. Elevate the leg when you are not up walking on it.   Continue to use the breathing machine which will help keep your temperature down.  It is common for your temperature to cycle up and down following surgery, especially at night when you are not up moving around and exerting yourself.  The breathing machine keeps your lungs expanded and your temperature down.  DIET You may resume your previous home diet once your are discharged from the hospital.  DRESSING / WOUND CARE / SHOWERING You have an adhesive waterproof bandage over the  incision. Leave this in place until your first follow-up appointment. Once you remove this you will not need to place another bandage.  You may begin showering 3 days following surgery, but do not submerge the incision under water.  ACTIVITY For the first 3-5 days, it is important to rest and keep the operative leg elevated. You should, as a general rule, rest for 50 minutes and walk/stretch for 10 minutes per hour. After 5 days, you may slowly increase activity as tolerated.  Perform the exercises you were provided twice a day for about 15-20 minutes each session. Begin these 2 days following surgery. Walk with your walker as instructed. Use the walker until you are comfortable transitioning to a cane. Walk with the cane in the opposite hand of the operative leg. You may discontinue the cane once you are comfortable and walking steadily. Avoid periods of inactivity such as sitting longer than an hour when not asleep. This helps prevent blood clots.  Do not drive a car for 6 weeks or until released by your surgeon.  Do not drive while taking narcotics.  TED HOSE STOCKINGS Wear the elastic stockings on both legs for three weeks following surgery during the day. You may remove them at night while sleeping.  WEIGHT BEARING Weight bearing as tolerated with assist device (walker, cane, etc) as directed, use it as long as suggested by your surgeon or therapist, typically at least 4-6 weeks.  POSTOPERATIVE CONSTIPATION PROTOCOL Constipation - defined medically as fewer than three stools per week and severe constipation as less than one stool per week.  less than one stool per week.  One of the most common issues patients have following surgery is constipation.  Even if you have a regular bowel pattern at home, your normal regimen is likely to be disrupted due to multiple reasons following surgery.  Combination of anesthesia, postoperative narcotics, change in appetite and fluid intake all can  affect your bowels.  In order to avoid complications following surgery, here are some recommendations in order to help you during your recovery period.  Colace (docusate) - Pick up an over-the-counter form of Colace or another stool softener and take twice a day as long as you are requiring postoperative pain medications.  Take with a full glass of water daily.  If you experience loose stools or diarrhea, hold the colace until you stool forms back up.  If your symptoms do not get better within 1 week or if they get worse, check with your doctor. Dulcolax (bisacodyl) - Pick up over-the-counter and take as directed by the product packaging as needed to assist with the movement of your bowels.  Take with a full glass of water.  Use this product as needed if not relieved by Colace only.  MiraLax (polyethylene glycol) - Pick up over-the-counter to have on hand.  MiraLax is a solution that will increase the amount of water in your bowels to assist with bowel movements.  Take as directed and can mix with a glass of water, juice, soda, coffee, or tea.  Take if you go more than two days without a movement.Do not use MiraLax more than once per day. Call your doctor if you are still constipated or irregular after using this medication for 7 days in a row.  If you continue to have problems with postoperative constipation, please contact the office for further assistance and recommendations.  If you experience "the worst abdominal pain ever" or develop nausea or vomiting, please contact the office immediatly for further recommendations for treatment.  ITCHING  If you experience itching with your medications, try taking only a single pain pill, or even half a pain pill at a time.  You can also use Benadryl over the counter for itching or also to help with sleep.   MEDICATIONS See your medication summary on the "After Visit Summary" that the nursing staff will review with you prior to discharge.  You may have some home  medications which will be placed on hold until you complete the course of blood thinner medication.  It is important for you to complete the blood thinner medication as prescribed by your surgeon.  Continue your approved medications as instructed at time of discharge.  PRECAUTIONS If you experience chest pain or shortness of breath - call 911 immediately for transfer to the hospital emergency department.  If you develop a fever greater that 101 F, purulent drainage from wound, increased redness or drainage from wound, foul odor from the wound/dressing, or calf pain - CONTACT YOUR SURGEON.                                                   FOLLOW-UP APPOINTMENTS Make sure you keep all of your appointments after your operation with your surgeon and caregivers. You should call the office at the above phone number and make an appointment for approximately two weeks after the date of your surgery or on the   date instructed by your surgeon outlined in the "After Visit Summary".  RANGE OF MOTION AND STRENGTHENING EXERCISES  These exercises are designed to help you keep full movement of your hip joint. Follow your caregiver's or physical therapist's instructions. Perform all exercises about fifteen times, three times per day or as directed. Exercise both hips, even if you have had only one joint replacement. These exercises can be done on a training (exercise) mat, on the floor, on a table or on a bed. Use whatever works the best and is most comfortable for you. Use music or television while you are exercising so that the exercises are a pleasant break in your day. This will make your life better with the exercises acting as a break in routine you can look forward to.  Lying on your back, slowly slide your foot toward your buttocks, raising your knee up off the floor. Then slowly slide your foot back down until your leg is straight again.  Lying on your back spread your legs as far apart as you can without causing  discomfort.  Lying on your side, raise your upper leg and foot straight up from the floor as far as is comfortable. Slowly lower the leg and repeat.  Lying on your back, tighten up the muscle in the front of your thigh (quadriceps muscles). You can do this by keeping your leg straight and trying to raise your heel off the floor. This helps strengthen the largest muscle supporting your knee.  Lying on your back, tighten up the muscles of your buttocks both with the legs straight and with the knee bent at a comfortable angle while keeping your heel on the floor.   POST-OPERATIVE OPIOID TAPER INSTRUCTIONS: It is important to wean off of your opioid medication as soon as possible. If you do not need pain medication after your surgery it is ok to stop day one. Opioids include: Codeine, Hydrocodone(Norco, Vicodin), Oxycodone(Percocet, oxycontin) and hydromorphone amongst others.  Long term and even short term use of opiods can cause: Increased pain response Dependence Constipation Depression Respiratory depression And more.  Withdrawal symptoms can include Flu like symptoms Nausea, vomiting And more Techniques to manage these symptoms Hydrate well Eat regular healthy meals Stay active Use relaxation techniques(deep breathing, meditating, yoga) Do Not substitute Alcohol to help with tapering If you have been on opioids for less than two weeks and do not have pain than it is ok to stop all together.  Plan to wean off of opioids This plan should start within one week post op of your joint replacement. Maintain the same interval or time between taking each dose and first decrease the dose.  Cut the total daily intake of opioids by one tablet each day Next start to increase the time between doses. The last dose that should be eliminated is the evening dose.   IF YOU ARE TRANSFERRED TO A SKILLED REHAB FACILITY If the patient is transferred to a skilled rehab facility following release from the  hospital, a list of the current medications will be sent to the facility for the patient to continue.  When discharged from the skilled rehab facility, please have the facility set up the patient's Home Health Physical Therapy prior to being released. Also, the skilled facility will be responsible for providing the patient with their medications at time of release from the facility to include their pain medication, the muscle relaxants, and their blood thinner medication. If the patient is still at the rehab facility   up appointment, the skilled rehab facility will also need to assist the patient in arranging follow up appointment in our office and any transportation needs.  MAKE SURE YOU:  Understand these instructions.  Get help right away if you are not doing well or get worse.    DENTAL ANTIBIOTICS:  In most cases prophylactic antibiotics for Dental procdeures after total joint surgery are not necessary.  Exceptions are as follows:  1. History of prior total joint infection  2. Severely immunocompromised (Organ Transplant, cancer chemotherapy, Rheumatoid biologic meds such as Winters)  3. Poorly controlled diabetes (A1C &gt; 8.0, blood glucose over 200)  If you have one of these conditions, contact your surgeon for an antibiotic prescription, prior to your dental procedure.    Pick up stool softner and laxative for home use following surgery while on pain medications. Do not submerge incision under water. Please use good hand washing techniques while changing dressing each day. May shower starting three days after surgery. Please use a clean towel to pat the incision dry following showers. Continue to use ice for pain and swelling after surgery. Do not use any lotions or creams on the incision until instructed by your surgeon.  ____________________________________________________  Information on my medicine - XARELTO (Rivaroxaban)  This medication education was reviewed with me  or my healthcare representative as part of my discharge preparation.    Why was Xarelto prescribed for you? Xarelto was prescribed for you to reduce the risk of blood clots forming after orthopedic surgery. The medical term for these abnormal blood clots is venous thromboembolism (VTE).  What do you need to know about xarelto ? Take your Xarelto ONCE DAILY at the same time every day. You may take it either with or without food.  If you have difficulty swallowing the tablet whole, you may crush it and mix in applesauce just prior to taking your dose.  Take Xarelto exactly as prescribed by your doctor and DO NOT stop taking Xarelto without talking to the doctor who prescribed the medication.  Stopping without other VTE prevention medication to take the place of Xarelto may increase your risk of developing a clot.  After discharge, you should have regular check-up appointments with your healthcare provider that is prescribing your Xarelto.    What do you do if you miss a dose? If you miss a dose, take it as soon as you remember on the same day then continue your regularly scheduled once daily regimen the next day. Do not take two doses of Xarelto on the same day.   Important Safety Information A possible side effect of Xarelto is bleeding. You should call your healthcare provider right away if you experience any of the following: Bleeding from an injury or your nose that does not stop. Unusual colored urine (red or dark brown) or unusual colored stools (red or black). Unusual bruising for unknown reasons. A serious fall or if you hit your head (even if there is no bleeding).  Some medicines may interact with Xarelto and might increase your risk of bleeding while on Xarelto. To help avoid this, consult your healthcare provider or pharmacist prior to using any new prescription or non-prescription medications, including herbals, vitamins, non-steroidal anti-inflammatory drugs (NSAIDs)  and supplements.  This website has more information on Xarelto: [email or web address]

## 2021-08-10 NOTE — Op Note (Signed)
OPERATIVE REPORT- TOTAL HIP ARTHROPLASTY   PREOPERATIVE DIAGNOSIS: Osteoarthritis of the Right hip.   POSTOPERATIVE DIAGNOSIS: Osteoarthritis of the Right  hip.   PROCEDURE: Right total hip arthroplasty, anterior approach.   SURGEON: Gaynelle Arabian, MD   ASSISTANT: Theresa Duty, PA-C  ANESTHESIA:  Spinal  ESTIMATED BLOOD LOSS:-450 mL    DRAINS: Hemovac x1.   COMPLICATIONS: None   CONDITION: PACU - hemodynamically stable.   BRIEF CLINICAL NOTE: Kevin Mcintyre is a 87 y.o. male who has advanced end-  stage arthritis of their Right  hip with progressively worsening pain and  dysfunction.The patient has failed nonoperative management and presents for  total hip arthroplasty.   PROCEDURE IN DETAIL: After successful administration of spinal  anesthetic, the traction boots for the [facility] bed were placed on both  feet and the patient was placed onto the [facility] bed, boots placed into the leg  holders. The Right hip was then isolated from the perineum with plastic  drapes and prepped and draped in the usual sterile fashion. ASIS and  greater trochanter were marked and a oblique incision was made, starting  at about 1 cm lateral and 2 cm distal to the ASIS and coursing towards  the anterior cortex of the femur. The skin was cut with a 10 blade  through subcutaneous tissue to the level of the fascia overlying the  tensor fascia lata muscle. The fascia was then incised in line with the  incision at the junction of the anterior third and posterior 2/3rd. The  muscle was teased off the fascia and then the interval between the TFL  and the rectus was developed. The Hohmann retractor was then placed at  the top of the femoral neck over the capsule. The vessels overlying the  capsule were cauterized and the fat on top of the capsule was removed.  A Hohmann retractor was then placed anterior underneath the rectus  femoris to give exposure to the entire anterior capsule. A T-shaped   capsulotomy was performed. The edges were tagged and the femoral head  was identified.       Osteophytes are removed off the superior acetabulum.  The femoral neck was then cut in situ with an oscillating saw. Traction  was then applied to the left lower extremity utilizing the [facility]  traction. The femoral head was then removed. Retractors were placed  around the acetabulum and then circumferential removal of the labrum was  performed. Osteophytes were also removed. Reaming starts at 49 mm to  medialize and  Increased in 2 mm increments to 53 mm. We reamed in  approximately 40 degrees of abduction, 20 degrees anteversion. A 54 mm  pinnacle acetabular shell was then impacted in anatomic position under  fluoroscopic guidance with excellent purchase. We did not need to place  any additional dome screws. A 36 mm neutral + 4 marathon liner was then  placed into the acetabular shell.       The femoral lift was then placed along the lateral aspect of the femur  just distal to the vastus ridge. The leg was  externally rotated and capsule  was stripped off the inferior aspect of the femoral neck down to the  level of the lesser trochanter, this was done with electrocautery. The femur was lifted after this was performed. The  leg was then placed in an extended and adducted position essentially delivering the femur. We also removed the capsule superiorly and the piriformis from the piriformis  fossa to gain excellent exposure of the  proximal femur. Rongeur was used to remove some cancellous bone to get  into the lateral portion of the proximal femur for placement of the  initial starter reamer. The starter broaches was placed  the starter broach  and was shown to go down the center of the canal. Broaching  with the Actis system was then performed starting at size 0  coursing  Up to size 4. A size 4 had excellent torsional and rotational  and axial stability. The trial high offset neck was then placed   with a 36 + 5 trial head. The hip was then reduced. We confirmed that  the stem was in the canal both on AP and lateral x-rays. It also has excellent sizing. The hip was reduced with outstanding stability through full extension and full external rotation.. AP pelvis was taken and the leg lengths were measured and found to be equal. Hip was then dislocated again and the femoral head and neck removed. The  femoral broach was removed. Size 4 Actis stem with a high offset  neck was then impacted into the femur following native anteversion. Has  excellent purchase in the canal. Excellent torsional and rotational and  axial stability. It is confirmed to be in the canal on AP and lateral  fluoroscopic views. The 36 + 5 metal head was placed and the hip  reduced with outstanding stability. Again AP pelvis was taken and it  confirmed that the leg lengths were equal. The wound was then copiously  irrigated with saline solution and the capsule reattached and repaired  with Ethibond suture. 30 ml of .25% Bupivicaine was  injected into the capsule and into the edge of the tensor fascia lata as well as subcutaneous tissue. The fascia overlying the tensor fascia lata was then closed with a running #1 V-Loc. Subcu was closed with interrupted 2-0 Vicryl and subcuticular running 4-0 Monocryl. Incision was cleaned  and dried. Steri-Strips and a bulky sterile dressing applied. The patient was awakened and transported to  recovery in stable condition.        Please note that a surgical assistant was a medical necessity for this procedure to perform it in a safe and expeditious manner. Assistant was necessary to provide appropriate retraction of vital neurovascular structures and to prevent femoral fracture and allow for anatomic placement of the prosthesis.  Gaynelle Arabian, M.D.

## 2021-08-10 NOTE — Evaluation (Signed)
Physical Therapy Evaluation Patient Details Name: Kevin Mcintyre MRN: [id number] DOB: 1934-05-27 Today's Date: 08/10/2021  History of Present Illness  Patient is 87 y.o. male s/p Rt THA anterior approach on 08/10/21 with PMH significant for OA, GERD, HTN, ACDF.  Clinical Impression  Kevin Mcintyre is a 87 y.o. male POD 0 s/p Rt THA. Patient reports independence with mobility at baseline. Patient is now limited by functional impairments (see PT problem list below) and requires min assist for transfers and gait with RW. Patient was able to ambulate ~!2 feet with RW and min assist to steady due to slight hip weakness secondary to spinal block. Patient instructed in exercise to facilitate ROM and circulation to manage edema. Patient will benefit from continued skilled PT interventions to address impairments and progress towards PLOF. Acute PT will follow to progress mobility and stair training in preparation for safe discharge home.        Recommendations for follow up therapy are one component of a multi-disciplinary discharge planning process, led by the attending physician.  Recommendations may be updated based on patient status, additional functional criteria and insurance authorization.  Follow Up Recommendations Follow surgeon's recommendation for DC plan and follow-up therapies    Equipment Recommendations  Rolling walker with 5" wheels    Recommendations for Other Services       Precautions / Restrictions Precautions Precautions: Fall Restrictions Weight Bearing Restrictions: No Other Position/Activity Restrictions: WBAT      Mobility  Bed Mobility Overal bed mobility: Needs Assistance Bed Mobility: Supine to Sit     Supine to sit: Min assist;HOB elevated     General bed mobility comments: cues to use bed rail, assist for Rt LE to EOB and extra time to scoot anteriorly.    Transfers Overall transfer level: Needs assistance Equipment used: Rolling walker (2  wheeled) Transfers: Sit to/from Stand Sit to Stand: Min assist         General transfer comment: cuse for hand placement for power up, pt's stance narrow and cues needed to increase step width. Assist for power up and to steady.  Ambulation/Gait Ambulation/Gait assistance: Min assist Gait Distance (Feet): 14 Feet Assistive device: Rolling walker (2 wheeled) Gait Pattern/deviations: Step-to pattern;Decreased stride length;Decreased weight shift to right;Narrow base of support Gait velocity: decr   General Gait Details: NBOS with gait and cues needed to increase width. Assist steady due to hip weakness and manage walker to turn in hallway.  Stairs            Wheelchair Mobility    Modified Rankin (Stroke Patients Only)       Balance Overall balance assessment: Needs assistance Sitting-balance support: Feet supported Sitting balance-Leahy Scale: Good     Standing balance support: During functional activity;Bilateral upper extremity supported Standing balance-Leahy Scale: Poor                               Pertinent Vitals/Pain Pain Assessment: Faces Faces Pain Scale: Hurts little more Pain Location: Rt hip Pain Descriptors / Indicators: Aching;Discomfort Pain Intervention(s): Limited activity within patient's tolerance;Monitored during session;Repositioned;Ice applied    Home Living Family/patient expects to be discharged to:: Private residence Living Arrangements: Spouse/significant other Available Help at Discharge: Family Type of Home: House Home Access: Stairs to enter Entrance Stairs-Rails: None Entrance Stairs-Number of Steps: 1 Home Layout: One level Home Equipment: Cane - single point;Walker - 2 wheels;Bedside commode;Toilet riser;Grab bars - tub/shower Additional Comments:  pt's wife will assist during recovery    Prior Function Level of Independence: Independent               Hand Dominance   Dominant Hand: Right     Extremity/Trunk Assessment   Upper Extremity Assessment Upper Extremity Assessment: Overall WFL for tasks assessed         Cervical / Trunk Assessment Cervical / Trunk Assessment: Normal  Communication   Communication: HOH  Cognition Arousal/Alertness: Awake/alert Behavior During Therapy: WFL for tasks assessed/performed Overall Cognitive Status: Within Functional Limits for tasks assessed                                        General Comments      Exercises Total Joint Exercises Ankle Circles/Pumps: AROM;Both;20 reps;Seated   Assessment/Plan    PT Assessment Patient needs continued PT services  PT Problem List Decreased strength;Decreased range of motion;Decreased activity tolerance;Decreased balance;Decreased mobility;Decreased knowledge of use of DME;Decreased knowledge of precautions       PT Treatment Interventions Gait training;DME instruction;Stair training;Functional mobility training;Therapeutic activities;Therapeutic exercise;Balance training;Patient/family education    PT Goals (Current goals can be found in the Care Plan section)  Acute Rehab PT Goals Patient Stated Goal: regain independence PT Goal Formulation: With patient Time For Goal Achievement: 08/17/21 Potential to Achieve Goals: Good    Frequency 7X/week   Barriers to discharge        Co-evaluation               AM-PAC PT "6 Clicks" Mobility  Outcome Measure Help needed turning from your back to your side while in a flat bed without using bedrails?: A Little Help needed moving from lying on your back to sitting on the side of a flat bed without using bedrails?: A Little Help needed moving to and from a bed to a chair (including a wheelchair)?: A Little Help needed standing up from a chair using your arms (e.g., wheelchair or bedside chair)?: A Little Help needed to walk in hospital room?: A Little Help needed climbing 3-5 steps with a railing? : A Lot 6 Click  Score: 17    End of Session Equipment Utilized During Treatment: Gait belt Activity Tolerance: Patient tolerated treatment well;Patient limited by pain Patient left: in chair;with call bell/phone within reach;with chair alarm set;with family/visitor present Nurse Communication: Mobility status PT Visit Diagnosis: Muscle weakness (generalized) (M62.81);Difficulty in walking, not elsewhere classified (R26.2)    Time: 5701-7793 PT Time Calculation (min) (ACUTE ONLY): 23 min   Charges:   PT Evaluation $PT Eval Low Complexity: 1 Low PT Treatments $Gait Training: 8-22 mins        Verner Mould, DPT Acute Rehabilitation Services Office [phone] Pager [phone]   Jacques Navy 08/10/2021, 7:09 PM

## 2021-08-10 NOTE — Care Plan (Signed)
Ortho Bundle Case Management Note  Patient Details  Name: Kevin Mcintyre MRN: [id number] Date of Birth: [birth year]  R THA on 08-10-21 DCP:  Home with wife.  1 story home with 1 ste.  DME:  No needs. Has a RW and 3-in-1. PT:  HEP                   DME Arranged:  N/A DME Agency:  NA  HH Arranged:  NA HH Agency:  NA  Additional Comments: Please contact me with any questions of if this plan should need to change.  Marianne Sofia, RN,CCM [facility]  [phone] 08/10/2021, 12:28 PM

## 2021-08-10 NOTE — Transfer of Care (Signed)
Immediate Anesthesia Transfer of Care Note  Patient: Kevin Mcintyre  Procedure(s) Performed: TOTAL HIP ARTHROPLASTY ANTERIOR APPROACH (Right: Hip)  Patient Location: PACU  Anesthesia Type:Spinal  Level of Consciousness: awake  Airway & Oxygen Therapy: Patient Spontanous Breathing and Patient connected to face mask oxygen  Post-op Assessment: Report given to RN and Post -op Vital signs reviewed and stable  Post vital signs: Reviewed and stable  Last Vitals:  Vitals Value Taken Time  BP 103/69 08/10/21 1126  Temp    Pulse 54 08/10/21 1127  Resp 11 08/10/21 1127  SpO2 99 % 08/10/21 1127  Vitals shown include unvalidated device data.  Last Pain:  Vitals:   08/10/21 0811  TempSrc:   PainSc: 0-No pain      Patients Stated Pain Goal: 3 (88/41/66 0630)  Complications: No notable events documented.

## 2021-08-10 NOTE — Anesthesia Postprocedure Evaluation (Signed)
Anesthesia Post Note  Patient: Kevin Mcintyre  Procedure(s) Performed: TOTAL HIP ARTHROPLASTY ANTERIOR APPROACH (Right: Hip)     Patient location during evaluation: PACU Anesthesia Type: Spinal Level of consciousness: awake Pain management: pain level controlled Vital Signs Assessment: post-procedure vital signs reviewed and stable Respiratory status: spontaneous breathing Cardiovascular status: stable Postop Assessment: no headache, no backache, spinal receding, patient able to bend at knees and no apparent nausea or vomiting Anesthetic complications: no   No notable events documented.  Last Vitals:  Vitals:   08/10/21 1200 08/10/21 1215  BP: 107/72 110/73  Pulse: 64 64  Resp: 19 18  Temp:    SpO2: 100% 97%    Last Pain:  Vitals:   08/10/21 1215  TempSrc:   PainSc: 0-No pain                 Huston Foley

## 2021-08-10 NOTE — Anesthesia Procedure Notes (Signed)
Procedure Name: MAC Date/Time: 08/10/2021 9:53 AM Performed by: Claudia Desanctis, CRNA Pre-anesthesia Checklist: Patient identified, Emergency Drugs available, Suction available and Patient being monitored Patient Re-evaluated:Patient Re-evaluated prior to induction Oxygen Delivery Method: Simple face mask

## 2021-08-11 ENCOUNTER — Other Ambulatory Visit: Payer: Self-pay

## 2021-08-11 DIAGNOSIS — R7303 Prediabetes: Secondary | ICD-10-CM | POA: Diagnosis not present

## 2021-08-11 DIAGNOSIS — M1611 Unilateral primary osteoarthritis, right hip: Secondary | ICD-10-CM | POA: Diagnosis not present

## 2021-08-11 DIAGNOSIS — I10 Essential (primary) hypertension: Secondary | ICD-10-CM | POA: Diagnosis not present

## 2021-08-11 DIAGNOSIS — Z79899 Other long term (current) drug therapy: Secondary | ICD-10-CM | POA: Diagnosis not present

## 2021-08-11 DIAGNOSIS — Z7982 Long term (current) use of aspirin: Secondary | ICD-10-CM | POA: Diagnosis not present

## 2021-08-11 LAB — BASIC METABOLIC PANEL
Anion gap: 6 (ref 5–15)
BUN: 19 mg/dL (ref 8–23)
CO2: 26 mmol/L (ref 22–32)
Calcium: 8.2 mg/dL — ABNORMAL LOW (ref 8.9–10.3)
Chloride: 104 mmol/L (ref 98–111)
Creatinine, Ser: 1.34 mg/dL — ABNORMAL HIGH (ref 0.61–1.24)
GFR, Estimated: 52 mL/min — ABNORMAL LOW (ref >60)
Glucose, Bld: 146 mg/dL — ABNORMAL HIGH (ref 70–99)
Potassium: 3.9 mmol/L (ref 3.5–5.1)
Sodium: 136 mmol/L (ref 135–145)

## 2021-08-11 LAB — CBC
HCT: 34.8 % — ABNORMAL LOW (ref 39.0–52.0)
Hemoglobin: 11.4 g/dL — ABNORMAL LOW (ref 13.0–17.0)
MCH: 30.6 pg (ref 26.0–34.0)
MCHC: 32.8 g/dL (ref 30.0–36.0)
MCV: 93.3 fL (ref 80.0–100.0)
Platelets: 175 10*3/uL (ref 150–400)
RBC: 3.73 MIL/uL — ABNORMAL LOW (ref 4.22–5.81)
RDW: 14.4 % (ref 11.5–15.5)
WBC: 11.4 10*3/uL — ABNORMAL HIGH (ref 4.0–10.5)
nRBC: 0 % (ref 0.0–0.2)

## 2021-08-11 MED ORDER — RIVAROXABAN 10 MG PO TABS: 10.0000 mg | ORAL_TABLET | Freq: Every day | ORAL | 0 refills | Status: AC

## 2021-08-11 MED ORDER — HYDROCODONE-ACETAMINOPHEN 5-325 MG PO TABS: 1.0000 | ORAL_TABLET | Freq: Four times a day (QID) | ORAL | 0 refills | Status: AC | PRN

## 2021-08-11 MED ORDER — METHOCARBAMOL 500 MG PO TABS: 500.0000 mg | ORAL_TABLET | Freq: Four times a day (QID) | ORAL | 0 refills | Status: AC | PRN

## 2021-08-11 MED ORDER — TRAMADOL HCL 50 MG PO TABS: 50.0000 mg | ORAL_TABLET | Freq: Four times a day (QID) | ORAL | 0 refills | Status: AC | PRN

## 2021-08-11 NOTE — TOC Transition Note (Signed)
Transition of Care [facility]) - CM/SW Discharge Note   Patient Details  Name: Kevin Mcintyre MRN: [id number] Date of Birth: Sep 07, 1934  [facility] CM/SW Contact:  Lennart Pall, LCSW Phone Number: 08/11/2021, 10:30 AM   Clinical Narrative:    Met with pt and confirming he has all needed DME at home.  Plan for HEP.  No further TOC needs.   Final next level of care: Home/Self Care Barriers to Discharge: No Barriers Identified   Patient Goals and CMS Choice Patient states their goals for this hospitalization and ongoing recovery are:: return home      Discharge Placement                       Discharge Plan and Services                DME Arranged: N/A DME Agency: NA       HH Arranged: NA HH Agency: NA        Social Determinants of Health (SDOH) Interventions     Readmission Risk Interventions No flowsheet data found.

## 2021-08-11 NOTE — Progress Notes (Signed)
   Subjective: 1 Day Post-Op Procedure(s) (LRB): TOTAL HIP ARTHROPLASTY ANTERIOR APPROACH (Right) Patient reports pain as mild.   Patient seen in rounds by Dr. Wynelle Link. Patient is well, and has had no acute complaints or problems. Denies SOB, chest pain, or calf pain. No acute overnight events. Ambulated 14 feet with therapy yesterday. Foley pulled this am.   We will continue therapy today.   Objective: Vital signs in last 24 hours: Temp:  [97.4 F (36.3 C)-98.5 F (36.9 C)] 98.1 F (36.7 C) (10/20 0515) Pulse Rate:  [60-83] 70 (10/20 0515) Resp:  [12-23] 16 (10/20 0515) BP: (84-151)/(61-84) 113/68 (10/20 0515) SpO2:  [96 %-100 %] 99 % (10/20 0515) Weight:  [78.9 kg] 78.9 kg (10/19 0811)  Intake/Output from previous day:  Intake/Output Summary (Last 24 hours) at 08/11/2021 0728 Last data filed at 08/11/2021 0517 Gross per 24 hour  Intake 3098.72 ml  Output 2950 ml  Net 148.72 ml     Intake/Output this shift: No intake/output data recorded.  Labs: Recent Labs    08/11/21 0324  HGB 11.4*   Recent Labs    08/11/21 0324  WBC 11.4*  RBC 3.73*  HCT 34.8*  PLT 175   Recent Labs    08/11/21 0324  NA 136  K 3.9  CL 104  CO2 26  BUN 19  CREATININE 1.34*  GLUCOSE 146*  CALCIUM 8.2*   No results for input(s): LABPT, INR in the last 72 hours.  Exam: General - Patient is Alert and Oriented Extremity - Neurologically intact Neurovascular intact Intact pulses distally Dorsiflexion/Plantar flexion intact Dressing - dressing C/D/I Motor Function - intact, moving foot and toes well on exam.   Past Medical History:  Diagnosis Date   Arthritis    hip- R- Feels a 'little catch in his hip"   Back problem    Chondrosarcoma of femur, left (HCC)    GERD (gastroesophageal reflux disease)    H/O exercise stress test 10/23/2010   pt. told that it was wnl   Hypertension    Pre-diabetes     Assessment/Plan: 1 Day Post-Op Procedure(s) (LRB): TOTAL HIP  ARTHROPLASTY ANTERIOR APPROACH (Right) Principal Problem:   OA (osteoarthritis) of hip Active Problems:   Primary osteoarthritis of right hip  Estimated body mass index is 27.25 kg/m as calculated from the following:   Height as of this encounter: 5\' 7"  (1.702 m).   Weight as of this encounter: 78.9 kg. Up with therapy  DVT Prophylaxis - Xarelto and TED hose Weight bearing as tolerated. Continue therapy.  Plan is to go Home after hospital stay.   Plan for two sessions with PT today, and if meeting goals, will plan for discharge this afternoon.   Patient to follow up in two weeks with Dr. Wynelle Link in clinic.   The PDMP database was reviewed today prior to any opioid medications being prescribed to this patient.Fenton Foy, [place], PA-C Orthopedic Surgery [phone] 08/11/2021, 7:28 AM

## 2021-08-11 NOTE — Progress Notes (Signed)
Physical Therapy Treatment Patient Details Name: Kevin Mcintyre MRN: [id number] DOB: [birth year] Today's Date: 08/11/2021   History of Present Illness Patient is 87 y.o. male s/p Rt THA anterior approach on 08/10/21 with PMH significant for OA, GERD, HTN, ACDF.    PT Comments    Pt ambulated 20' with RW, distance limited by lightheadedness. Assisted pt to recliner where BP was 119/76, HR 67, SaO2 99% on room air. Instructed pt in THA HEP. Will plan on a second PT session this afternoon.     Recommendations for follow up therapy are one component of a multi-disciplinary discharge planning process, led by the attending physician.  Recommendations may be updated based on patient status, additional functional criteria and insurance authorization.  Follow Up Recommendations  Follow surgeon's recommendation for DC plan and follow-up therapies     Equipment Recommendations  Rolling walker with 5" wheels    Recommendations for Other Services       Precautions / Restrictions Precautions Precautions: Fall Restrictions Weight Bearing Restrictions: No Other Position/Activity Restrictions: WBAT     Mobility  Bed Mobility Overal bed mobility: Needs Assistance Bed Mobility: Supine to Sit     Supine to sit: Mod assist     General bed mobility comments: assist to raise trunk and advance RLE    Transfers Overall transfer level: Needs assistance Equipment used: Rolling walker (2 wheeled) Transfers: Sit to/from Stand Sit to Stand: Min assist         General transfer comment: cuse for hand placement for power up, Assist for power up and to steady.  Ambulation/Gait Ambulation/Gait assistance: Min guard Gait Distance (Feet): 20 Feet Assistive device: Rolling walker (2 wheeled) Gait Pattern/deviations: Step-to pattern;Decreased stride length;Decreased weight shift to right;Narrow base of support Gait velocity: decr   General Gait Details: VCs sequencing, distance limited by  lightheadedness, assisted pt to recliner where BP was 119/76, HR 67, SaO2 99% on room air   Stairs             Wheelchair Mobility    Modified Rankin (Stroke Patients Only)       Balance Overall balance assessment: Needs assistance Sitting-balance support: Feet supported Sitting balance-Leahy Scale: Good     Standing balance support: During functional activity;Bilateral upper extremity supported Standing balance-Leahy Scale: Poor                              Cognition Arousal/Alertness: Awake/alert Behavior During Therapy: WFL for tasks assessed/performed Overall Cognitive Status: Within Functional Limits for tasks assessed                                        Exercises Total Joint Exercises Ankle Circles/Pumps: AROM;Both;20 reps;Seated Quad Sets: AROM;Right;5 reps;Supine Short Arc Quad: AROM;Right;10 reps;Supine Heel Slides: AAROM;Right;10 reps;Supine Hip ABduction/ADduction: AAROM;Right;10 reps;Supine Long Arc Quad: AROM;Right;5 reps;Seated    General Comments        Pertinent Vitals/Pain Pain Assessment: 0-10 Pain Score: 7  Pain Location: Rt hip Pain Descriptors / Indicators: Aching;Discomfort Pain Intervention(s): Limited activity within patient's tolerance;Monitored during session;Premedicated before session;Ice applied;Repositioned    Home Living                      Prior Function            PT Goals (current goals can now be found in  the care plan section) Acute Rehab PT Goals Patient Stated Goal: regain independence, walk better, be able to squat down PT Goal Formulation: With patient Time For Goal Achievement: 08/17/21 Potential to Achieve Goals: Good Progress towards PT goals: Progressing toward goals    Frequency    7X/week      PT Plan Current plan remains appropriate    Co-evaluation              AM-PAC PT "6 Clicks" Mobility   Outcome Measure  Help needed turning from your  back to your side while in a flat bed without using bedrails?: A Little Help needed moving from lying on your back to sitting on the side of a flat bed without using bedrails?: A Little Help needed moving to and from a bed to a chair (including a wheelchair)?: A Little Help needed standing up from a chair using your arms (e.g., wheelchair or bedside chair)?: A Little Help needed to walk in hospital room?: A Little Help needed climbing 3-5 steps with a railing? : A Lot 6 Click Score: 17    End of Session Equipment Utilized During Treatment: Gait belt Activity Tolerance: Patient limited by pain;Treatment limited secondary to medical complications (Comment) (lightheaded with walking) Patient left: in chair;with call bell/phone within reach;with chair alarm set Nurse Communication: Mobility status PT Visit Diagnosis: Muscle weakness (generalized) (M62.81);Difficulty in walking, not elsewhere classified (R26.2)     Time: 0109-3235 PT Time Calculation (min) (ACUTE ONLY): 20 min  Charges:  $Gait Training: 8-22 mins                    Blondell Reveal Kistler PT 08/11/2021  Acute Rehabilitation Services Pager [phone] Office [phone]

## 2021-08-11 NOTE — Progress Notes (Signed)
Physical Therapy Treatment Patient Details Name: Kevin Mcintyre MRN: [id number] DOB: [birth year] Today's Date: 08/11/2021   History of Present Illness Patient is 87 y.o. male s/p Rt THA anterior approach on 08/10/21 with PMH significant for OA, GERD, HTN, ACDF.    PT Comments    Pt ambulated 120' with RW, no lightheadedness this session. Stair training completed. Reviewed HEP with pt and his wife. He is ready to DC home from a PT standpoint.     Recommendations for follow up therapy are one component of a multi-disciplinary discharge planning process, led by the attending physician.  Recommendations may be updated based on patient status, additional functional criteria and insurance authorization.  Follow Up Recommendations  Follow surgeon's recommendation for DC plan and follow-up therapies     Equipment Recommendations  Rolling walker with 5" wheels    Recommendations for Other Services       Precautions / Restrictions Precautions Precautions: Fall Restrictions Weight Bearing Restrictions: No Other Position/Activity Restrictions: WBAT     Mobility  Bed Mobility Overal bed mobility: Needs Assistance Bed Mobility: Sit to Supine     Supine to sit: Mod assist Sit to supine: Min guard   General bed mobility comments: instructed pt to self assist RLE with LLE for getting into bed    Transfers Overall transfer level: Needs assistance Equipment used: Rolling walker (2 wheeled) Transfers: Sit to/from Stand Sit to Stand: Supervision         General transfer comment: cuse for hand placement for power up  Ambulation/Gait Ambulation/Gait assistance: Supervision Gait Distance (Feet): 120 Feet Assistive device: Rolling walker (2 wheeled) Gait Pattern/deviations: Step-to pattern;Decreased stride length;Decreased weight shift to right;Narrow base of support Gait velocity: decr   General Gait Details: steady, no loss of balance, no lightheadedness   Stairs Stairs:  Yes Stairs assistance: Min assist Stair Management: No rails;Forwards;Backwards;With walker Number of Stairs: 1 General stair comments: 1 step with RW x 2 trials, once forwards and once backwards, VCs sequencing, min A to manage RW   Wheelchair Mobility    Modified Rankin (Stroke Patients Only)       Balance Overall balance assessment: Needs assistance Sitting-balance support: Feet supported Sitting balance-Leahy Scale: Good     Standing balance support: During functional activity;Bilateral upper extremity supported Standing balance-Leahy Scale: Poor                              Cognition Arousal/Alertness: Awake/alert Behavior During Therapy: WFL for tasks assessed/performed Overall Cognitive Status: Within Functional Limits for tasks assessed                                        Exercises Total Joint Exercises Ankle Circles/Pumps: AROM;Both;20 reps;Seated Quad Sets: AROM;Right;5 reps;Supine Short Arc Quad: AROM;Right;10 reps;Supine Heel Slides: AAROM;Right;10 reps;Supine Hip ABduction/ADduction: AAROM;Right;10 reps;Supine Long Arc Quad: AROM;Right;5 reps;Seated    General Comments        Pertinent Vitals/Pain Pain Assessment: 0-10 Pain Score: 5  Pain Location: Rt hip with activity Pain Descriptors / Indicators: Aching;Discomfort Pain Intervention(s): Limited activity within patient's tolerance;Monitored during session;Premedicated before session;Ice applied;Repositioned    Home Living                      Prior Function            PT Goals (current goals  can now be found in the care plan section) Acute Rehab PT Goals Patient Stated Goal: regain independence, walk better, be able to squat down PT Goal Formulation: With patient Time For Goal Achievement: 08/17/21 Potential to Achieve Goals: Good Progress towards PT goals: Progressing toward goals    Frequency    7X/week      PT Plan Current plan remains  appropriate    Co-evaluation              AM-PAC PT "6 Clicks" Mobility   Outcome Measure  Help needed turning from your back to your side while in a flat bed without using bedrails?: None Help needed moving from lying on your back to sitting on the side of a flat bed without using bedrails?: A Little Help needed moving to and from a bed to a chair (including a wheelchair)?: None Help needed standing up from a chair using your arms (e.g., wheelchair or bedside chair)?: None Help needed to walk in hospital room?: None Help needed climbing 3-5 steps with a railing? : A Little 6 Click Score: 22    End of Session Equipment Utilized During Treatment: Gait belt Activity Tolerance: Patient tolerated treatment well (lightheaded with walking) Patient left: with call bell/phone within reach;in bed;with family/visitor present Nurse Communication: Mobility status PT Visit Diagnosis: Muscle weakness (generalized) (M62.81);Difficulty in walking, not elsewhere classified (R26.2)     Time: 4174-0814 PT Time Calculation (min) (ACUTE ONLY): 33 min  Charges:  $Gait Training: 8-22 mins $Therapeutic Exercise: 8-22 mins                     Blondell Reveal Kistler PT 08/11/2021  Acute Rehabilitation Services Pager [phone] Office [phone]

## 2021-08-12 ENCOUNTER — Encounter (HOSPITAL_COMMUNITY): Payer: Self-pay | Admitting: Orthopedic Surgery

## 2021-08-15 NOTE — Discharge Summary (Signed)
Physician Discharge Summary   Patient ID: KEONTA ALSIP MRN: [id number] DOB/AGE: 1934/08/31 87 y.o.  Admit date: 08/10/2021 Discharge date: 08/11/2021  Primary Diagnosis: s/p Left THA   Admission Diagnoses:  Past Medical History:  Diagnosis Date   Arthritis    hip- R- Feels a 'little catch in his hip"   Back problem    Chondrosarcoma of femur, left (HCC)    GERD (gastroesophageal reflux disease)    H/O exercise stress test 10/23/2010   pt. told that it was wnl   Hypertension    Pre-diabetes    Discharge Diagnoses:   Principal Problem:   OA (osteoarthritis) of hip Active Problems:   Primary osteoarthritis of right hip  Estimated body mass index is 27.25 kg/m as calculated from the following:   Height as of this encounter: 5\' 7"  (1.702 m).   Weight as of this encounter: 78.9 kg.  Procedure:  Procedure(s) (LRB): TOTAL HIP ARTHROPLASTY ANTERIOR APPROACH (Right)   Consults: None  HPI: BRIEF CLINICAL NOTE: Kevin Mcintyre is a 87 y.o. male who has advanced end-stage arthritis of their Right  hip with progressively worsening pain and dysfunction.The patient has failed nonoperative management and presents for total hip arthroplasty.   Laboratory Data: Admission on 08/10/2021, Discharged on 08/11/2021  Component Date Value Ref Range Status   ABO/RH(D) 08/10/2021    Final                   Value:O POS Performed at [facility] [address] [place], [place] [address]    Glucose-Capillary 08/10/2021 108 (A)  70 - 99 mg/dL Final   Glucose reference range applies only to samples taken after fasting for at least 8 hours.   WBC 08/11/2021 11.4 (A)  4.0 - 10.5 K/uL Final   RBC 08/11/2021 3.73 (A)  4.22 - 5.81 MIL/uL Final   Hemoglobin 08/11/2021 11.4 (A)  13.0 - 17.0 g/dL Final   HCT 08/11/2021 34.8 (A)  39.0 - 52.0 % Final   MCV 08/11/2021 93.3  80.0 - 100.0 fL Final   MCH 08/11/2021 30.6  26.0 - 34.0 pg Final   MCHC 08/11/2021 32.8  30.0 - 36.0  g/dL Final   RDW 08/11/2021 14.4  11.5 - 15.5 % Final   Platelets 08/11/2021 175  150 - 400 K/uL Final   nRBC 08/11/2021 0.0  0.0 - 0.2 % Final   Performed at [facility], [place] [address], [place], [place] [address]   Sodium 08/11/2021 136  135 - 145 mmol/L Final   Potassium 08/11/2021 3.9  3.5 - 5.1 mmol/L Final   Chloride 08/11/2021 104  98 - 111 mmol/L Final   CO2 08/11/2021 26  22 - 32 mmol/L Final   Glucose, Bld 08/11/2021 146 (A)  70 - 99 mg/dL Final   Glucose reference range applies only to samples taken after fasting for at least 8 hours.   BUN 08/11/2021 19  8 - 23 mg/dL Final   Creatinine, Ser 08/11/2021 1.34 (A)  0.61 - 1.24 mg/dL Final   Calcium 08/11/2021 8.2 (A)  8.9 - 10.3 mg/dL Final   GFR, Estimated 08/11/2021 52 (A)  >60 mL/min Final   Comment: (NOTE) Calculated using the CKD-EPI Creatinine Equation (2021)    Anion gap 08/11/2021 6  5 - 15 Final   Performed at [facility], [place] [address] [place], [place] [address]  Orders Only on 08/08/2021  Component Date Value Ref Range Status  SARS Coronavirus 2 08/08/2021 RESULT: NEGATIVE   Final   Comment: RESULT: NEGATIVESARS-CoV-2 INTERPRETATION:A NEGATIVE  test result means that SARS-CoV-2 RNA was not present in the specimen above the limit of detection of this test. This does not preclude a possible SARS-CoV-2 infection and should not be used as the  sole basis for patient management decisions. Negative results must be combined with clinical observations, patient history, and epidemiological information. Optimum specimen types and timing for peak viral levels during infections caused by SARS-CoV-2  have not been determined. Collection of multiple specimens or types of specimens may be necessary to detect virus. Improper specimen collection and handling, sequence variability under primers/probes, or organism present below the limit of detection may  lead to false negative results. Positive  and negative predictive values of testing are highly dependent on prevalence. False negative test results are more likely when prevalence of disease is high.The expected result is NEGATIVE.Fact S                          heet for  Healthcare Providers: LocalChronicle.no Sheet for Patients: SalonLookup.es Reference Range - Negative   Hospital Outpatient Visit on 08/02/2021  Component Date Value Ref Range Status   Glucose-Capillary 08/02/2021 100 (A)  70 - 99 mg/dL Final   Glucose reference range applies only to samples taken after fasting for at least 8 hours.   MRSA, PCR 08/02/2021 NEGATIVE  NEGATIVE Final   Staphylococcus aureus 08/02/2021 POSITIVE (A)  NEGATIVE Final   Comment: (NOTE) The Xpert SA Assay (FDA approved for NASAL specimens in patients 62 years of age and older), is one component of a comprehensive surveillance program. It is not intended to diagnose infection nor to guide or monitor treatment. Performed at [facility], [place] [address], [place], [place] [address]    Hgb A1c MFr Bld 08/02/2021 6.3 (A)  4.8 - 5.6 % Final   Comment: (NOTE) Pre diabetes:          5.7%-6.4%  Diabetes:              >6.4%  Glycemic control for   <7.0% adults with diabetes    Mean Plasma Glucose 08/02/2021 134.11  mg/dL Final   Performed at [facility], [place] [address] [place], [place] [address]   WBC 08/02/2021 8.2  4.0 - 10.5 K/uL Final   RBC 08/02/2021 4.63  4.22 - 5.81 MIL/uL Final   Hemoglobin 08/02/2021 14.3  13.0 - 17.0 g/dL Final   HCT 08/02/2021 43.5  39.0 - 52.0 % Final   MCV 08/02/2021 94.0  80.0 - 100.0 fL Final   MCH 08/02/2021 30.9  26.0 - 34.0 pg Final   MCHC 08/02/2021 32.9  30.0 - 36.0 g/dL Final   RDW 08/02/2021 14.6  11.5 - 15.5 % Final   Platelets 08/02/2021 230  150 - 400 K/uL Final   nRBC 08/02/2021 0.0  0.0 - 0.2 % Final   Performed at [facility], [place]  [address] [place], [place] [address]   Sodium 08/02/2021 136  135 - 145 mmol/L Final   Potassium 08/02/2021 4.1  3.5 - 5.1 mmol/L Final   Chloride 08/02/2021 102  98 - 111 mmol/L Final   CO2 08/02/2021 26  22 - 32 mmol/L Final   Glucose, Bld 08/02/2021 98  70 - 99 mg/dL Final   Glucose reference range applies only to samples taken after fasting for at least 8 hours.  BUN 08/02/2021 25 (A)  8 - 23 mg/dL Final   Creatinine, Ser 08/02/2021 1.37 (A)  0.61 - 1.24 mg/dL Final   Calcium 08/02/2021 9.4  8.9 - 10.3 mg/dL Final   Total Protein 08/02/2021 7.7  6.5 - 8.1 g/dL Final   Albumin 08/02/2021 3.9  3.5 - 5.0 g/dL Final   AST 08/02/2021 27  15 - 41 U/L Final   ALT 08/02/2021 21  0 - 44 U/L Final   Alkaline Phosphatase 08/02/2021 38  38 - 126 U/L Final   Total Bilirubin 08/02/2021 0.7  0.3 - 1.2 mg/dL Final   GFR, Estimated 08/02/2021 51 (A)  >60 mL/min Final   Comment: (NOTE) Calculated using the CKD-EPI Creatinine Equation (2021)    Anion gap 08/02/2021 8  5 - 15 Final   Performed at [facility], [place] [address], [place], [place] [address]   ABO/RH(D) 08/02/2021 O POS   Final   Antibody Screen 08/02/2021 NEG   Final   Sample Expiration 08/02/2021 08/13/2021,2359   Final   Extend sample reason 08/02/2021    Final                   Value:NO TRANSFUSIONS OR PREGNANCY IN THE PAST 3 MONTHS Performed at [facility] [address], [place], [place] [address]    Prothrombin Time 08/02/2021 13.3  11.4 - 15.2 seconds Final   INR 08/02/2021 1.0  0.8 - 1.2 Final   Comment: (NOTE) INR goal varies based on device and disease states. Performed at [facility], [place] [address] [place],  [address]      X-Rays:DG Pelvis Portable  Result Date: 08/10/2021 CLINICAL DATA:  Status post right total hip replacement. EXAM: PORTABLE PELVIS 1-2 VIEWS COMPARISON:  None. FINDINGS: Right total hip arthroplasty. Subcutaneous and joint air are  present. Previous left total hip arthroplasty. Degenerative changes in the sacroiliac joints. Postoperative changes in the lower lumbar spine. IMPRESSION: Right total hip arthroplasty with expected postoperative findings. Electronically Signed   By: Lorin Picket M.D.   On: 08/10/2021 13:03   DG C-Arm 1-60 Min-No Report  Result Date: 08/10/2021 Fluoroscopy was utilized by the requesting physician.  No radiographic interpretation.   DG HIP UNILAT WITH PELVIS 1V RIGHT  Result Date: 08/10/2021 CLINICAL DATA:  Right hip placement EXAM: DG HIP (WITH OR WITHOUT PELVIS) 1V RIGHT COMPARISON:  None. FINDINGS: Intraoperative images demonstrate right hip osteoarthritis with subsequent right total hip arthroplasty. No evidence of complication on these images. IMPRESSION: Post total right hip arthroplasty. Electronically Signed   By: Macy Mis M.D.   On: 08/10/2021 11:40    EKG: Orders placed or performed during the hospital encounter of 09/21/20   EKG 12 lead per protocol   EKG 12 lead per protocol     Hospital Course: PIERSON VANTOL is a 87 y.o. who was admitted to [facility]. They were brought to the operating room on 08/10/2021 and underwent Procedure(s): [place].  Patient tolerated the procedure well and was later transferred to the recovery room and then to the orthopaedic floor for postoperative care. They were given PO and IV analgesics for pain control following their surgery. They were given 24 hours of postoperative antibiotics of  Anti-infectives (From admission, onward)    Start     Dose/Rate Route Frequency Ordered Stop   08/10/21 1600  ceFAZolin (ANCEF) IVPB 2g/100 mL premix        2  g 200 mL/hr over 30 Minutes Intravenous Every 6 hours 08/10/21 1306 08/10/21 2236   08/10/21 0745  ceFAZolin (ANCEF) IVPB 2g/100 mL premix        2 g 200 mL/hr over 30 Minutes Intravenous On call to O.R. 08/10/21 7619 08/10/21 0956      and started on  DVT prophylaxis in the form of Xarelto and TED hose.   PT and OT were ordered for total joint protocol. Discharge planning consulted to help with postop disposition and equipment needs.  Patient had an uneventful night on the evening of surgery. They started to get up OOB with therapy on POD#1. Pt was seen during rounds and was ready to go home pending progress with therapy. He worked with therapy on POD #1 and was meeting goals. Pt was discharged to home later that day in stable condition.  Diet: Regular diet Activity: WBAT Follow-up: in two weeks Disposition: Home Discharged Condition: good   Discharge Instructions     Call MD / Call 911   Complete by: As directed    If you experience chest pain or shortness of breath, CALL 911 and be transported to the hospital emergency room.  If you develope a fever above 101 F, pus (white drainage) or increased drainage or redness at the wound, or calf pain, call your surgeon's office.   Change dressing   Complete by: As directed    You have an adhesive waterproof bandage over the incision. Leave this in place until your first follow-up appointment. Once you remove this you will not need to place another bandage.   Constipation Prevention   Complete by: As directed    Drink plenty of fluids.  Prune juice may be helpful.  You may use a stool softener, such as Colace (over the counter) 100 mg twice a day.  Use MiraLax (over the counter) for constipation as needed.   Diet - low sodium heart healthy   Complete by: As directed    Do not sit on low chairs, stoools or toilet seats, as it may be difficult to get up from low surfaces   Complete by: As directed    Driving restrictions   Complete by: As directed    No driving for two weeks   Post-operative opioid taper instructions:   Complete by: As directed    POST-OPERATIVE OPIOID TAPER INSTRUCTIONS: It is important to wean off of your opioid medication as soon as possible. If you do not need pain  medication after your surgery it is ok to stop day one. Opioids include: Codeine, Hydrocodone(Norco, Vicodin), Oxycodone(Percocet, oxycontin) and hydromorphone amongst others.  Long term and even short term use of opiods can cause: Increased pain response Dependence Constipation Depression Respiratory depression And more.  Withdrawal symptoms can include Flu like symptoms Nausea, vomiting And more Techniques to manage these symptoms Hydrate well Eat regular healthy meals Stay active Use relaxation techniques(deep breathing, meditating, yoga) Do Not substitute Alcohol to help with tapering If you have been on opioids for less than two weeks and do not have pain than it is ok to stop all together.  Plan to wean off of opioids This plan should start within one week post op of your joint replacement. Maintain the same interval or time between taking each dose and first decrease the dose.  Cut the total daily intake of opioids by one tablet each day Next start to increase the time between doses. The last dose that should be eliminated is the evening  dose.      TED hose   Complete by: As directed    Use stockings (TED hose) for three weeks on both leg(s).  You may remove them at night for sleeping.   Weight bearing as tolerated   Complete by: As directed       Allergies as of 08/11/2021       Reactions   Shellfish Allergy Nausea Only   Shrimp - ok        Medication List     STOP taking these medications    aspirin 81 MG tablet   Vitamin D 50 MCG (2000 UT) tablet       TAKE these medications    acetaminophen 325 MG tablet Commonly known as: TYLENOL Take 650 mg by mouth every 6 (six) hours as needed for moderate pain.   BIOFREEZE EX Apply 1 application topically daily as needed (pain).   docusate sodium 100 MG capsule Commonly known as: COLACE Take 200 mg by mouth daily.   famotidine 40 MG tablet Commonly known as: PEPCID Take 40 mg by mouth at  bedtime.   fluticasone 50 MCG/ACT nasal spray Commonly known as: FLONASE Place 2 sprays into both nostrils daily. What changed: when to take this   HYDROcodone-acetaminophen 5-325 MG tablet Commonly known as: NORCO/VICODIN Take 1-2 tablets by mouth every 6 (six) hours as needed for severe pain.   losartan-hydrochlorothiazide 50-12.5 MG tablet Commonly known as: HYZAAR Take 1 tablet by mouth daily.   methocarbamol 500 MG tablet Commonly known as: ROBAXIN Take 1 tablet (500 mg total) by mouth every 6 (six) hours as needed for muscle spasms.   MUCINEX FAST-MAX COLD/FLU PO Take 1 Dose by mouth daily as needed (cold symptoms).   pantoprazole 40 MG tablet Commonly known as: PROTONIX Take 40 mg by mouth 2 (two) times daily.   rivaroxaban 10 MG Tabs tablet Commonly known as: XARELTO Take 1 tablet (10 mg total) by mouth daily with breakfast.   traMADol 50 MG tablet Commonly known as: ULTRAM Take 1-2 tablets (50-100 mg total) by mouth every 6 (six) hours as needed for moderate pain. What changed:  how much to take when to take this   VICKS SINEX SEVERE NA Place 2 sprays into the nose daily as needed (congestion).               Discharge Care Instructions  (From admission, onward)           Start     Ordered   08/11/21 0000  Weight bearing as tolerated        08/11/21 0732   08/11/21 0000  Change dressing       Comments: You have an adhesive waterproof bandage over the incision. Leave this in place until your first follow-up appointment. Once you remove this you will not need to place another bandage.   08/11/21 0732            Follow-up Information     Gaynelle Arabian, MD. Go on 08/23/2021.   Specialty: Orthopedic Surgery Why: You are scheduled for a follow up appointment on 08-23-21 at 2:00 pm. Contact information: [address] [place] [address] [phone]                 Signed: Fenton Foy, MBA, PA-C Orthopedic  Surgery 08/15/2021, 10:51 PM

## 2021-09-13 DIAGNOSIS — Z471 Aftercare following joint replacement surgery: Secondary | ICD-10-CM | POA: Diagnosis not present

## 2021-09-14 DIAGNOSIS — Z23 Encounter for immunization: Secondary | ICD-10-CM | POA: Diagnosis not present

## 2021-09-14 DIAGNOSIS — Z0000 Encounter for general adult medical examination without abnormal findings: Secondary | ICD-10-CM | POA: Diagnosis not present

## 2021-09-14 DIAGNOSIS — I10 Essential (primary) hypertension: Secondary | ICD-10-CM | POA: Diagnosis not present

## 2021-09-14 DIAGNOSIS — R1013 Epigastric pain: Secondary | ICD-10-CM | POA: Diagnosis not present

## 2021-09-14 DIAGNOSIS — M47812 Spondylosis without myelopathy or radiculopathy, cervical region: Secondary | ICD-10-CM | POA: Diagnosis not present

## 2021-09-14 DIAGNOSIS — K219 Gastro-esophageal reflux disease without esophagitis: Secondary | ICD-10-CM | POA: Diagnosis not present

## 2021-09-14 DIAGNOSIS — K4090 Unilateral inguinal hernia, without obstruction or gangrene, not specified as recurrent: Secondary | ICD-10-CM | POA: Diagnosis not present

## 2021-09-14 DIAGNOSIS — R7303 Prediabetes: Secondary | ICD-10-CM | POA: Diagnosis not present

## 2021-09-14 DIAGNOSIS — Z1389 Encounter for screening for other disorder: Secondary | ICD-10-CM | POA: Diagnosis not present

## 2021-09-28 DIAGNOSIS — Z23 Encounter for immunization: Secondary | ICD-10-CM | POA: Diagnosis not present

## 2021-10-10 DIAGNOSIS — N1831 Chronic kidney disease, stage 3a: Secondary | ICD-10-CM | POA: Diagnosis not present

## 2021-10-28 DIAGNOSIS — Z86010 Personal history of colonic polyps: Secondary | ICD-10-CM | POA: Diagnosis not present

## 2021-10-28 DIAGNOSIS — R195 Other fecal abnormalities: Secondary | ICD-10-CM | POA: Diagnosis not present

## 2021-10-28 DIAGNOSIS — D649 Anemia, unspecified: Secondary | ICD-10-CM | POA: Diagnosis not present

## 2021-10-28 DIAGNOSIS — K5900 Constipation, unspecified: Secondary | ICD-10-CM | POA: Diagnosis not present

## 2021-11-03 DIAGNOSIS — D500 Iron deficiency anemia secondary to blood loss (chronic): Secondary | ICD-10-CM | POA: Diagnosis not present

## 2021-11-24 DIAGNOSIS — E049 Nontoxic goiter, unspecified: Secondary | ICD-10-CM | POA: Diagnosis not present

## 2021-11-24 DIAGNOSIS — Z96641 Presence of right artificial hip joint: Secondary | ICD-10-CM | POA: Diagnosis not present

## 2021-11-24 DIAGNOSIS — R1032 Left lower quadrant pain: Secondary | ICD-10-CM | POA: Diagnosis not present

## 2021-11-24 DIAGNOSIS — C4022 Malignant neoplasm of long bones of left lower limb: Secondary | ICD-10-CM | POA: Diagnosis not present

## 2021-11-24 DIAGNOSIS — Z08 Encounter for follow-up examination after completed treatment for malignant neoplasm: Secondary | ICD-10-CM | POA: Diagnosis not present

## 2021-11-24 DIAGNOSIS — Z85830 Personal history of malignant neoplasm of bone: Secondary | ICD-10-CM | POA: Diagnosis not present

## 2021-11-30 DIAGNOSIS — Z789 Other specified health status: Secondary | ICD-10-CM | POA: Diagnosis not present

## 2021-11-30 DIAGNOSIS — R29898 Other symptoms and signs involving the musculoskeletal system: Secondary | ICD-10-CM | POA: Diagnosis not present

## 2021-11-30 DIAGNOSIS — C4022 Malignant neoplasm of long bones of left lower limb: Secondary | ICD-10-CM | POA: Diagnosis not present

## 2021-11-30 DIAGNOSIS — Z7409 Other reduced mobility: Secondary | ICD-10-CM | POA: Diagnosis not present

## 2021-11-30 DIAGNOSIS — M25652 Stiffness of left hip, not elsewhere classified: Secondary | ICD-10-CM | POA: Diagnosis not present

## 2021-12-09 DIAGNOSIS — Z789 Other specified health status: Secondary | ICD-10-CM | POA: Diagnosis not present

## 2021-12-09 DIAGNOSIS — R29898 Other symptoms and signs involving the musculoskeletal system: Secondary | ICD-10-CM | POA: Diagnosis not present

## 2021-12-09 DIAGNOSIS — Z7409 Other reduced mobility: Secondary | ICD-10-CM | POA: Diagnosis not present

## 2021-12-09 DIAGNOSIS — M25652 Stiffness of left hip, not elsewhere classified: Secondary | ICD-10-CM | POA: Diagnosis not present

## 2021-12-09 DIAGNOSIS — C4022 Malignant neoplasm of long bones of left lower limb: Secondary | ICD-10-CM | POA: Diagnosis not present

## 2021-12-22 DIAGNOSIS — C4022 Malignant neoplasm of long bones of left lower limb: Secondary | ICD-10-CM | POA: Diagnosis not present

## 2021-12-22 DIAGNOSIS — Z789 Other specified health status: Secondary | ICD-10-CM | POA: Diagnosis not present

## 2021-12-22 DIAGNOSIS — M25652 Stiffness of left hip, not elsewhere classified: Secondary | ICD-10-CM | POA: Diagnosis not present

## 2021-12-22 DIAGNOSIS — R29898 Other symptoms and signs involving the musculoskeletal system: Secondary | ICD-10-CM | POA: Diagnosis not present

## 2021-12-22 DIAGNOSIS — Z7409 Other reduced mobility: Secondary | ICD-10-CM | POA: Diagnosis not present

## 2021-12-29 DIAGNOSIS — R29898 Other symptoms and signs involving the musculoskeletal system: Secondary | ICD-10-CM | POA: Diagnosis not present

## 2021-12-29 DIAGNOSIS — Z789 Other specified health status: Secondary | ICD-10-CM | POA: Diagnosis not present

## 2021-12-29 DIAGNOSIS — C4022 Malignant neoplasm of long bones of left lower limb: Secondary | ICD-10-CM | POA: Diagnosis not present

## 2021-12-29 DIAGNOSIS — Z7409 Other reduced mobility: Secondary | ICD-10-CM | POA: Diagnosis not present

## 2021-12-29 DIAGNOSIS — M25652 Stiffness of left hip, not elsewhere classified: Secondary | ICD-10-CM | POA: Diagnosis not present

## 2022-01-02 DIAGNOSIS — Z789 Other specified health status: Secondary | ICD-10-CM | POA: Diagnosis not present

## 2022-01-02 DIAGNOSIS — Z7409 Other reduced mobility: Secondary | ICD-10-CM | POA: Diagnosis not present

## 2022-01-02 DIAGNOSIS — M25652 Stiffness of left hip, not elsewhere classified: Secondary | ICD-10-CM | POA: Diagnosis not present

## 2022-01-02 DIAGNOSIS — C4022 Malignant neoplasm of long bones of left lower limb: Secondary | ICD-10-CM | POA: Diagnosis not present

## 2022-01-02 DIAGNOSIS — R29898 Other symptoms and signs involving the musculoskeletal system: Secondary | ICD-10-CM | POA: Diagnosis not present

## 2022-01-11 DIAGNOSIS — Z7409 Other reduced mobility: Secondary | ICD-10-CM | POA: Diagnosis not present

## 2022-01-11 DIAGNOSIS — Z789 Other specified health status: Secondary | ICD-10-CM | POA: Diagnosis not present

## 2022-01-11 DIAGNOSIS — C4022 Malignant neoplasm of long bones of left lower limb: Secondary | ICD-10-CM | POA: Diagnosis not present

## 2022-01-11 DIAGNOSIS — M25652 Stiffness of left hip, not elsewhere classified: Secondary | ICD-10-CM | POA: Diagnosis not present

## 2022-01-11 DIAGNOSIS — R29898 Other symptoms and signs involving the musculoskeletal system: Secondary | ICD-10-CM | POA: Diagnosis not present

## 2022-01-20 DIAGNOSIS — Z789 Other specified health status: Secondary | ICD-10-CM | POA: Diagnosis not present

## 2022-01-20 DIAGNOSIS — C4022 Malignant neoplasm of long bones of left lower limb: Secondary | ICD-10-CM | POA: Diagnosis not present

## 2022-01-20 DIAGNOSIS — R29898 Other symptoms and signs involving the musculoskeletal system: Secondary | ICD-10-CM | POA: Diagnosis not present

## 2022-01-20 DIAGNOSIS — M25652 Stiffness of left hip, not elsewhere classified: Secondary | ICD-10-CM | POA: Diagnosis not present

## 2022-01-20 DIAGNOSIS — Z7409 Other reduced mobility: Secondary | ICD-10-CM | POA: Diagnosis not present

## 2022-02-27 DIAGNOSIS — W57XXXA Bitten or stung by nonvenomous insect and other nonvenomous arthropods, initial encounter: Secondary | ICD-10-CM | POA: Diagnosis not present

## 2022-02-27 DIAGNOSIS — R21 Rash and other nonspecific skin eruption: Secondary | ICD-10-CM | POA: Diagnosis not present

## 2022-03-15 DIAGNOSIS — D649 Anemia, unspecified: Secondary | ICD-10-CM | POA: Diagnosis not present

## 2022-03-15 DIAGNOSIS — K4090 Unilateral inguinal hernia, without obstruction or gangrene, not specified as recurrent: Secondary | ICD-10-CM | POA: Diagnosis not present

## 2022-03-15 DIAGNOSIS — M545 Low back pain, unspecified: Secondary | ICD-10-CM | POA: Diagnosis not present

## 2022-03-15 DIAGNOSIS — I129 Hypertensive chronic kidney disease with stage 1 through stage 4 chronic kidney disease, or unspecified chronic kidney disease: Secondary | ICD-10-CM | POA: Diagnosis not present

## 2022-03-15 DIAGNOSIS — J309 Allergic rhinitis, unspecified: Secondary | ICD-10-CM | POA: Diagnosis not present

## 2022-03-15 DIAGNOSIS — R7303 Prediabetes: Secondary | ICD-10-CM | POA: Diagnosis not present

## 2022-03-15 DIAGNOSIS — M47812 Spondylosis without myelopathy or radiculopathy, cervical region: Secondary | ICD-10-CM | POA: Diagnosis not present

## 2022-03-15 DIAGNOSIS — K219 Gastro-esophageal reflux disease without esophagitis: Secondary | ICD-10-CM | POA: Diagnosis not present

## 2022-03-15 DIAGNOSIS — R202 Paresthesia of skin: Secondary | ICD-10-CM | POA: Diagnosis not present

## 2022-04-27 ENCOUNTER — Other Ambulatory Visit: Payer: Self-pay

## 2022-04-27 ENCOUNTER — Emergency Department (HOSPITAL_BASED_OUTPATIENT_CLINIC_OR_DEPARTMENT_OTHER): Payer: Medicare PPO

## 2022-04-27 ENCOUNTER — Emergency Department (HOSPITAL_BASED_OUTPATIENT_CLINIC_OR_DEPARTMENT_OTHER)
Admission: EM | Admit: 2022-04-27 | Discharge: 2022-04-27 | Disposition: A | Discharge status: Home / Self Care | Payer: Medicare PPO | Attending: Emergency Medicine | Admitting: Emergency Medicine

## 2022-04-27 ENCOUNTER — Encounter (HOSPITAL_BASED_OUTPATIENT_CLINIC_OR_DEPARTMENT_OTHER): Payer: Self-pay

## 2022-04-27 DIAGNOSIS — W19XXXA Unspecified fall, initial encounter: Secondary | ICD-10-CM

## 2022-04-27 DIAGNOSIS — S0181XA Laceration without foreign body of other part of head, initial encounter: Secondary | ICD-10-CM

## 2022-04-27 DIAGNOSIS — Y9301 Activity, walking, marching and hiking: Secondary | ICD-10-CM | POA: Insufficient documentation

## 2022-04-27 DIAGNOSIS — S0083XA Contusion of other part of head, initial encounter: Secondary | ICD-10-CM | POA: Diagnosis not present

## 2022-04-27 DIAGNOSIS — S60212A Contusion of left wrist, initial encounter: Secondary | ICD-10-CM

## 2022-04-27 DIAGNOSIS — S0990XA Unspecified injury of head, initial encounter: Secondary | ICD-10-CM | POA: Diagnosis not present

## 2022-04-27 DIAGNOSIS — S20213A Contusion of bilateral front wall of thorax, initial encounter: Secondary | ICD-10-CM | POA: Diagnosis not present

## 2022-04-27 DIAGNOSIS — Z7901 Long term (current) use of anticoagulants: Secondary | ICD-10-CM | POA: Diagnosis not present

## 2022-04-27 DIAGNOSIS — R0789 Other chest pain: Secondary | ICD-10-CM

## 2022-04-27 DIAGNOSIS — W01198A Fall on same level from slipping, tripping and stumbling with subsequent striking against other object, initial encounter: Secondary | ICD-10-CM | POA: Insufficient documentation

## 2022-04-27 DIAGNOSIS — Z7982 Long term (current) use of aspirin: Secondary | ICD-10-CM | POA: Diagnosis not present

## 2022-04-27 DIAGNOSIS — S8001XA Contusion of right knee, initial encounter: Secondary | ICD-10-CM | POA: Diagnosis not present

## 2022-04-27 DIAGNOSIS — Z043 Encounter for examination and observation following other accident: Secondary | ICD-10-CM | POA: Diagnosis not present

## 2022-04-27 DIAGNOSIS — S0993XA Unspecified injury of face, initial encounter: Secondary | ICD-10-CM | POA: Diagnosis not present

## 2022-04-27 DIAGNOSIS — T07XXXA Unspecified multiple injuries, initial encounter: Secondary | ICD-10-CM

## 2022-04-27 DIAGNOSIS — S8002XA Contusion of left knee, initial encounter: Secondary | ICD-10-CM

## 2022-04-27 NOTE — ED Notes (Signed)
Pt. Had a fall yesterday out side on his drive way with noted injury to the L side of face at the L eye area and L cheek.  Pt. Has a small abrasion with bruising noted in this area.  Pt. Wife cleaned area and placed neosporin on the area.  Area is well cleaned with healing process noted.  Pt. Has noted bruising to the palm and top of the L hand with a small abrasion note to the L arm.    Pt. Has a small bruise noted to the L chest with tenderness noted in the bruised area.  Pt. In no resp. Distress with clear lung sounds.  Pt. L knee has noted large abrasion that has been cleaned with no bleeding and is scabbed over .  Pt. Is able to bend the L knee and walk on the L leg with  no problem.    Pt. Has an abrasion on the R knee with no bleeding noted.  The abrasion has been cleaned with abx ointment applied.

## 2022-04-27 NOTE — ED Triage Notes (Signed)
Pt was walking down his driveway yesterday morning to get his newspaper and had several dogs run up to him resulting in a fall. Pt reports that he did hit his head and is unsure whether he lost consciousness. Pt states that he takes aspirin daily but no other thinners.

## 2022-04-27 NOTE — Discharge Instructions (Signed)
You were seen in the emergency department for evaluation of injuries from a fall.  You had a CAT scan of your head and face along with x-rays of your chest left wrist and bilateral knees that did not show any obvious fracture.  Please use soap and water to your wounds.  Tylenol and ice as needed for pain.  Follow-up with your primary care doctor.  Return if any signs of infection or other concerns.

## 2022-04-27 NOTE — ED Provider Notes (Signed)
[place] EMERGENCY DEPARTMENT Provider Note   CSN: [id number] Arrival date & time: 04/27/22  1755     History  Chief Complaint  Patient presents with   Kevin Mcintyre    Kevin Mcintyre is a 88 y.o. male.  He is here for evaluation of injuries from a fall yesterday.  He was walking out to his mailbox and 3 dogs surprised him and he fell to the ground.  He struck his head and hit his glasses into his face causing a laceration.  He is also got pain in his left upper chest along with left wrist and bilateral knees.  Abrasions.  He does not think he lost consciousness.  He said he spit up some blood yesterday but that is resolved.  No abdominal pain or shortness of breath.  No numbness or weakness.  He is on aspirin but no other blood thinners.  No neck or back pain.  The history is provided by the patient.  Fall This is a new problem. The current episode started yesterday. Associated symptoms include chest pain. Pertinent negatives include no abdominal pain, no headaches and no shortness of breath. The symptoms are aggravated by bending and twisting. Nothing relieves the symptoms. He has tried rest for the symptoms. The treatment provided mild relief.       Home Medications Prior to Admission medications   Medication Sig Start Date End Date Taking? Authorizing Provider  acetaminophen (TYLENOL) 325 MG tablet Take 650 mg by mouth every 6 (six) hours as needed for moderate pain.    [provider]  docusate sodium (COLACE) 100 MG capsule Take 200 mg by mouth daily.    [provider]  famotidine (PEPCID) 40 MG tablet Take 40 mg by mouth at bedtime. 05/04/21   [provider]  fluticasone (FLONASE) 50 MCG/ACT nasal spray Place 2 sprays into both nostrils daily. Patient taking differently: Place 2 sprays into both nostrils at bedtime. 06/18/15   Ivar Drape D, PA  HYDROcodone-acetaminophen (NORCO/VICODIN) 5-325 MG tablet Take 1-2 tablets by mouth every 6  (six) hours as needed for severe pain. 08/11/21   Fenton Foy D, PA-C  losartan-hydrochlorothiazide (HYZAAR) 50-12.5 MG tablet Take 1 tablet by mouth daily.    [provider]  Menthol, Topical Analgesic, (BIOFREEZE EX) Apply 1 application topically daily as needed (pain).    [provider]  methocarbamol (ROBAXIN) 500 MG tablet Take 1 tablet (500 mg total) by mouth every 6 (six) hours as needed for muscle spasms. 08/11/21   Fenton Foy D, PA-C  Oxymetazoline HCl (VICKS SINEX SEVERE NA) Place 2 sprays into the nose daily as needed (congestion).    [provider]  pantoprazole (PROTONIX) 40 MG tablet Take 40 mg by mouth 2 (two) times daily.     [provider]  Phenylephrine-DM-GG-APAP (MUCINEX FAST-MAX COLD/FLU PO) Take 1 Dose by mouth daily as needed (cold symptoms).    [provider]  rivaroxaban (XARELTO) 10 MG TABS tablet Take 1 tablet (10 mg total) by mouth daily with breakfast. 08/11/21   Fenton Foy D, PA-C  traMADol (ULTRAM) 50 MG tablet Take 1-2 tablets (50-100 mg total) by mouth every 6 (six) hours as needed for moderate pain. 08/11/21   Fenton Foy D, PA-C      Allergies    Scopolamine and Shellfish allergy    Review of Systems   Review of Systems  Constitutional:  Negative for fever.  Eyes:  Negative for visual disturbance.  Respiratory:  Negative  for shortness of breath.   Cardiovascular:  Positive for chest pain.  Gastrointestinal:  Negative for abdominal pain.  Musculoskeletal:  Negative for neck pain.  Skin:  Positive for wound.  Neurological:  Negative for headaches.    Physical Exam Updated Vital Signs BP (!) 155/89 (BP Location: Left Arm)   Pulse 62   Temp 98.2 F (36.8 C) (Oral)   Resp 20   Ht '5\' 7"'$  (1.702 m)   Wt 78.9 kg   SpO2 96%   BMI 27.25 kg/m  Physical Exam Vitals and nursing note reviewed.  Constitutional:      General: He is not in acute distress.    Appearance: Normal appearance. He  is well-developed.  HENT:     Head: Normocephalic.     Comments: He has some ecchymosis below his left eye and a 2 cm laceration of his left cheek that is closed.  No facial crepitus or instability Eyes:     Conjunctiva/sclera: Conjunctivae normal.  Cardiovascular:     Rate and Rhythm: Normal rate and regular rhythm.     Heart sounds: No murmur heard. Pulmonary:     Effort: Pulmonary effort is normal. No respiratory distress.     Breath sounds: Normal breath sounds.  Chest:     Chest wall: Tenderness (Left upper midline chest tenderness.  No crepitus.) present.  Abdominal:     Palpations: Abdomen is soft.     Tenderness: There is no abdominal tenderness. There is no guarding or rebound.  Musculoskeletal:        General: Tenderness and signs of injury present. No deformity. Normal range of motion.     Cervical back: Neck supple.     Comments: Right upper extremity full range of motion without any pain or limitations.  Left upper extremity has some tenderness and abrasion over his left wrist.  Elbow shoulder nontender.  Bilateral lower extremities hips nontender.  He has abrasions and tenderness diffusely over his anterior knees.  Ankle is nontender.  Distal pulses motor and sensation intact.  No cervical thoracic or lumbar tenderness.  Skin:    General: Skin is warm and dry.     Capillary Refill: Capillary refill takes less than 2 seconds.  Neurological:     General: No focal deficit present.     Mental Status: He is alert.     Sensory: No sensory deficit.     Motor: No weakness.  Psychiatric:        Mood and Affect: Mood normal.     ED Results / Procedures / Treatments   Labs (all labs ordered are listed, but only abnormal results are displayed) Labs Reviewed - No data to display  EKG None  Radiology DG Knee Complete 4 Views Right  Result Date: 04/27/2022 CLINICAL DATA:  Fall EXAM: RIGHT KNEE - COMPLETE 4+ VIEW COMPARISON:  None Available. FINDINGS: No evidence of  fracture, dislocation, or joint effusion. No evidence of arthropathy or other focal bone abnormality. Soft tissues are unremarkable. IMPRESSION: Negative. Electronically Signed   By: Ulyses Jarred M.D.   On: 04/27/2022 19:24   DG Knee Complete 4 Views Left  Result Date: 04/27/2022 CLINICAL DATA:  Fall EXAM: LEFT KNEE - COMPLETE 4+ VIEW COMPARISON:  None Available. FINDINGS: No evidence of fracture, dislocation, or joint effusion. Soft tissues are unremarkable. Enchondroma or old bone infarct in the distal femur. IMPRESSION: Negative. Electronically Signed   By: Ulyses Jarred M.D.   On: 04/27/2022 19:23   DG Wrist Complete  Left  Result Date: 04/27/2022 CLINICAL DATA:  Fall EXAM: LEFT WRIST - COMPLETE 3+ VIEW COMPARISON:  01/06/2010 FINDINGS: There is no evidence of fracture or dislocation. There is no evidence of arthropathy or other focal bone abnormality. Soft tissues are unremarkable. IMPRESSION: Negative. Electronically Signed   By: Ulyses Jarred M.D.   On: 04/27/2022 19:22   CT Head Wo Contrast  Result Date: 04/27/2022 CLINICAL DATA:  Head trauma, minor (Age >= 65y); Facial trauma, blunt EXAM: CT HEAD WITHOUT CONTRAST CT MAXILLOFACIAL WITHOUT CONTRAST TECHNIQUE: Multidetector CT imaging of the head and maxillofacial structures were performed using the standard protocol without intravenous contrast. Multiplanar CT image reconstructions of the maxillofacial structures were also generated. RADIATION DOSE REDUCTION: This exam was performed according to the departmental dose-optimization program which includes automated exposure control, adjustment of the mA and/or kV according to patient size and/or use of iterative reconstruction technique. COMPARISON:  None Available. FINDINGS: CT HEAD FINDINGS BRAIN: BRAIN Patchy and confluent areas of decreased attenuation are noted throughout the deep and periventricular white matter of the cerebral hemispheres bilaterally, compatible with chronic microvascular  ischemic disease. No evidence of large-territorial acute infarction. No parenchymal hemorrhage. No mass lesion. No extra-axial collection. No mass effect or midline shift. No hydrocephalus. Basilar cisterns are patent. Vascular: No hyperdense vessel. Skull: No acute fracture or focal lesion. Other: None. CT MAXILLOFACIAL FINDINGS Osseous: No fracture or mandibular dislocation. No destructive process. Periapical lucency surrounding a right molar mandibular tooth. Sinuses/Orbits: Paranasal sinuses and mastoid air cells are clear. The orbits are unremarkable. Soft tissues: Negative. Other: Visualized upper cervical spine demonstrates C4-C5 anterior cervical discectomy and fusion. Grade 1 anterolisthesis C2 on C3. No acute displaced fracture or traumatic listhesis of the visualized portions of the cervical spine. IMPRESSION: 1. No acute intracranial abnormality. 2. No acute displaced facial fracture. 3. Periapical lucency surrounding a right molar mandibular tooth could represent loosening of the setting of trauma versus infection. Correlate clinically. Electronically Signed   By: Iven Finn M.D.   On: 04/27/2022 19:22   CT Maxillofacial WO CM  Result Date: 04/27/2022 CLINICAL DATA:  Head trauma, minor (Age >= 65y); Facial trauma, blunt EXAM: CT HEAD WITHOUT CONTRAST CT MAXILLOFACIAL WITHOUT CONTRAST TECHNIQUE: Multidetector CT imaging of the head and maxillofacial structures were performed using the standard protocol without intravenous contrast. Multiplanar CT image reconstructions of the maxillofacial structures were also generated. RADIATION DOSE REDUCTION: This exam was performed according to the departmental dose-optimization program which includes automated exposure control, adjustment of the mA and/or kV according to patient size and/or use of iterative reconstruction technique. COMPARISON:  None Available. FINDINGS: CT HEAD FINDINGS BRAIN: BRAIN Patchy and confluent areas of decreased attenuation are  noted throughout the deep and periventricular white matter of the cerebral hemispheres bilaterally, compatible with chronic microvascular ischemic disease. No evidence of large-territorial acute infarction. No parenchymal hemorrhage. No mass lesion. No extra-axial collection. No mass effect or midline shift. No hydrocephalus. Basilar cisterns are patent. Vascular: No hyperdense vessel. Skull: No acute fracture or focal lesion. Other: None. CT MAXILLOFACIAL FINDINGS Osseous: No fracture or mandibular dislocation. No destructive process. Periapical lucency surrounding a right molar mandibular tooth. Sinuses/Orbits: Paranasal sinuses and mastoid air cells are clear. The orbits are unremarkable. Soft tissues: Negative. Other: Visualized upper cervical spine demonstrates C4-C5 anterior cervical discectomy and fusion. Grade 1 anterolisthesis C2 on C3. No acute displaced fracture or traumatic listhesis of the visualized portions of the cervical spine. IMPRESSION: 1. No acute intracranial abnormality. 2. No acute displaced  facial fracture. 3. Periapical lucency surrounding a right molar mandibular tooth could represent loosening of the setting of trauma versus infection. Correlate clinically. Electronically Signed   By: Iven Finn M.D.   On: 04/27/2022 19:22   DG Ribs Unilateral W/Chest Left  Result Date: 04/27/2022 CLINICAL DATA:  Fall EXAM: LEFT RIBS AND CHEST - 3+ VIEW COMPARISON:  None Available. FINDINGS: No fracture or other bone lesions are seen involving the ribs. There is no evidence of pneumothorax or pleural effusion. Both lungs are clear. Heart size and mediastinal contours are within normal limits. IMPRESSION: Negative. Electronically Signed   By: Ulyses Jarred M.D.   On: 04/27/2022 19:21    Procedures Procedures    Medications Ordered in ED Medications - No data to display  ED Course/ Medical Decision Making/ A&P Clinical Course as of 04/28/22 0941  Thu Apr 27, 2022  1934 Imaging does not  show any fracture or dislocation.  No intracranial bleed.  Awaiting radiology reading. [MB]    Clinical Course User Index [MB] Hayden Rasmussen, MD                           Medical Decision Making Amount and/or Complexity of Data Reviewed Radiology: ordered.   This patient complains of pain to face left wrist bilateral knees after fall; this involves an extensive number of treatment Options and is a complaint that carries with it a high risk of complications and morbidity. The differential includes fracture, contusion, abrasions, dislocation I ordered imaging studies which included x-rays of chest knees wrist pain CT head and max face and I independently    visualized and interpreted imaging which showed no significant traumatic findings Additional history obtained from patient's wife Previous records obtained and reviewed in epic no recent admissions Cardiac monitoring reviewed, normal sinus rhythm Social determinants considered, no significant barriers Critical Interventions: None  After the interventions stated above, I reevaluated the patient and found patient be well-appearing Admission and further testing considered, no indications for admission or further work-up at this time.  Recommended symptomatic treatment and follow-up with PCP.  Return instructions discussed         Final Clinical Impression(s) / ED Diagnoses Final diagnoses:  Fall, initial encounter  Contusion of face, initial encounter  Contusion of right knee, initial encounter  Contusion of left knee, initial encounter  Contusion of left wrist, initial encounter  Abrasions of multiple sites  Chest wall pain  Facial laceration, initial encounter    Rx / DC Orders ED Discharge Orders     None         Hayden Rasmussen, MD 04/28/22 [phone]

## 2022-05-25 DIAGNOSIS — C4022 Malignant neoplasm of long bones of left lower limb: Secondary | ICD-10-CM | POA: Diagnosis not present

## 2022-06-08 DIAGNOSIS — H52223 Regular astigmatism, bilateral: Secondary | ICD-10-CM | POA: Diagnosis not present

## 2022-06-08 DIAGNOSIS — H5203 Hypermetropia, bilateral: Secondary | ICD-10-CM | POA: Diagnosis not present

## 2022-06-08 DIAGNOSIS — H524 Presbyopia: Secondary | ICD-10-CM | POA: Diagnosis not present

## 2022-06-08 DIAGNOSIS — H40023 Open angle with borderline findings, high risk, bilateral: Secondary | ICD-10-CM | POA: Diagnosis not present

## 2022-06-08 DIAGNOSIS — H2513 Age-related nuclear cataract, bilateral: Secondary | ICD-10-CM | POA: Diagnosis not present

## 2022-06-29 DIAGNOSIS — L309 Dermatitis, unspecified: Secondary | ICD-10-CM | POA: Diagnosis not present

## 2022-06-29 DIAGNOSIS — W57XXXD Bitten or stung by nonvenomous insect and other nonvenomous arthropods, subsequent encounter: Secondary | ICD-10-CM | POA: Diagnosis not present

## 2022-07-25 DIAGNOSIS — L309 Dermatitis, unspecified: Secondary | ICD-10-CM | POA: Diagnosis not present

## 2022-07-25 DIAGNOSIS — L989 Disorder of the skin and subcutaneous tissue, unspecified: Secondary | ICD-10-CM | POA: Diagnosis not present

## 2022-09-19 DIAGNOSIS — J309 Allergic rhinitis, unspecified: Secondary | ICD-10-CM | POA: Diagnosis not present

## 2022-09-19 DIAGNOSIS — Z0000 Encounter for general adult medical examination without abnormal findings: Secondary | ICD-10-CM | POA: Diagnosis not present

## 2022-09-19 DIAGNOSIS — Z23 Encounter for immunization: Secondary | ICD-10-CM | POA: Diagnosis not present

## 2022-09-19 DIAGNOSIS — I129 Hypertensive chronic kidney disease with stage 1 through stage 4 chronic kidney disease, or unspecified chronic kidney disease: Secondary | ICD-10-CM | POA: Diagnosis not present

## 2022-09-19 DIAGNOSIS — M47812 Spondylosis without myelopathy or radiculopathy, cervical region: Secondary | ICD-10-CM | POA: Diagnosis not present

## 2022-09-19 DIAGNOSIS — R7303 Prediabetes: Secondary | ICD-10-CM | POA: Diagnosis not present

## 2022-09-19 DIAGNOSIS — K219 Gastro-esophageal reflux disease without esophagitis: Secondary | ICD-10-CM | POA: Diagnosis not present

## 2022-09-19 DIAGNOSIS — R202 Paresthesia of skin: Secondary | ICD-10-CM | POA: Diagnosis not present

## 2022-09-19 DIAGNOSIS — Z1331 Encounter for screening for depression: Secondary | ICD-10-CM | POA: Diagnosis not present

## 2022-10-09 DIAGNOSIS — N1831 Chronic kidney disease, stage 3a: Secondary | ICD-10-CM | POA: Diagnosis not present

## 2022-10-20 DIAGNOSIS — N1831 Chronic kidney disease, stage 3a: Secondary | ICD-10-CM | POA: Diagnosis not present

## 2022-11-21 DIAGNOSIS — H2513 Age-related nuclear cataract, bilateral: Secondary | ICD-10-CM | POA: Diagnosis not present

## 2022-11-21 DIAGNOSIS — H40013 Open angle with borderline findings, low risk, bilateral: Secondary | ICD-10-CM | POA: Diagnosis not present

## 2022-11-21 DIAGNOSIS — H18413 Arcus senilis, bilateral: Secondary | ICD-10-CM | POA: Diagnosis not present

## 2022-11-21 DIAGNOSIS — H25013 Cortical age-related cataract, bilateral: Secondary | ICD-10-CM | POA: Diagnosis not present

## 2022-11-21 DIAGNOSIS — H2512 Age-related nuclear cataract, left eye: Secondary | ICD-10-CM | POA: Diagnosis not present

## 2022-11-30 DIAGNOSIS — Z96641 Presence of right artificial hip joint: Secondary | ICD-10-CM | POA: Diagnosis not present

## 2022-11-30 DIAGNOSIS — Z79899 Other long term (current) drug therapy: Secondary | ICD-10-CM | POA: Diagnosis not present

## 2022-11-30 DIAGNOSIS — C4022 Malignant neoplasm of long bones of left lower limb: Secondary | ICD-10-CM | POA: Diagnosis not present

## 2023-01-26 DIAGNOSIS — H2512 Age-related nuclear cataract, left eye: Secondary | ICD-10-CM | POA: Diagnosis not present

## 2023-01-26 DIAGNOSIS — H2511 Age-related nuclear cataract, right eye: Secondary | ICD-10-CM | POA: Diagnosis not present

## 2023-02-09 DIAGNOSIS — H2511 Age-related nuclear cataract, right eye: Secondary | ICD-10-CM | POA: Diagnosis not present

## 2023-03-13 DIAGNOSIS — L720 Epidermal cyst: Secondary | ICD-10-CM | POA: Diagnosis not present

## 2023-03-13 DIAGNOSIS — B009 Herpesviral infection, unspecified: Secondary | ICD-10-CM | POA: Diagnosis not present

## 2023-03-13 DIAGNOSIS — L821 Other seborrheic keratosis: Secondary | ICD-10-CM | POA: Diagnosis not present

## 2023-03-13 DIAGNOSIS — L309 Dermatitis, unspecified: Secondary | ICD-10-CM | POA: Diagnosis not present

## 2023-03-13 DIAGNOSIS — D485 Neoplasm of uncertain behavior of skin: Secondary | ICD-10-CM | POA: Diagnosis not present

## 2023-03-13 DIAGNOSIS — D225 Melanocytic nevi of trunk: Secondary | ICD-10-CM | POA: Diagnosis not present

## 2023-03-20 DIAGNOSIS — J309 Allergic rhinitis, unspecified: Secondary | ICD-10-CM | POA: Diagnosis not present

## 2023-03-20 DIAGNOSIS — R202 Paresthesia of skin: Secondary | ICD-10-CM | POA: Diagnosis not present

## 2023-03-20 DIAGNOSIS — E1169 Type 2 diabetes mellitus with other specified complication: Secondary | ICD-10-CM | POA: Diagnosis not present

## 2023-03-20 DIAGNOSIS — I700 Atherosclerosis of aorta: Secondary | ICD-10-CM | POA: Diagnosis not present

## 2023-03-20 DIAGNOSIS — N1831 Chronic kidney disease, stage 3a: Secondary | ICD-10-CM | POA: Diagnosis not present

## 2023-03-20 DIAGNOSIS — K219 Gastro-esophageal reflux disease without esophagitis: Secondary | ICD-10-CM | POA: Diagnosis not present

## 2023-03-20 DIAGNOSIS — E119 Type 2 diabetes mellitus without complications: Secondary | ICD-10-CM | POA: Diagnosis not present

## 2023-03-20 DIAGNOSIS — M47812 Spondylosis without myelopathy or radiculopathy, cervical region: Secondary | ICD-10-CM | POA: Diagnosis not present

## 2023-03-20 DIAGNOSIS — I129 Hypertensive chronic kidney disease with stage 1 through stage 4 chronic kidney disease, or unspecified chronic kidney disease: Secondary | ICD-10-CM | POA: Diagnosis not present

## 2023-03-20 DIAGNOSIS — E7800 Pure hypercholesterolemia, unspecified: Secondary | ICD-10-CM | POA: Diagnosis not present

## 2023-05-16 DIAGNOSIS — H59031 Cystoid macular edema following cataract surgery, right eye: Secondary | ICD-10-CM | POA: Diagnosis not present

## 2023-06-14 DIAGNOSIS — Z08 Encounter for follow-up examination after completed treatment for malignant neoplasm: Secondary | ICD-10-CM | POA: Diagnosis not present

## 2023-06-14 DIAGNOSIS — Z85830 Personal history of malignant neoplasm of bone: Secondary | ICD-10-CM | POA: Diagnosis not present

## 2023-06-14 DIAGNOSIS — J398 Other specified diseases of upper respiratory tract: Secondary | ICD-10-CM | POA: Diagnosis not present

## 2023-06-14 DIAGNOSIS — Z9889 Other specified postprocedural states: Secondary | ICD-10-CM | POA: Diagnosis not present

## 2023-06-14 DIAGNOSIS — C4022 Malignant neoplasm of long bones of left lower limb: Secondary | ICD-10-CM | POA: Diagnosis not present

## 2023-07-02 DIAGNOSIS — H26491 Other secondary cataract, right eye: Secondary | ICD-10-CM | POA: Diagnosis not present

## 2023-07-12 ENCOUNTER — Emergency Department (HOSPITAL_BASED_OUTPATIENT_CLINIC_OR_DEPARTMENT_OTHER)
Admission: EM | Admit: 2023-07-12 | Discharge: 2023-07-12 | Disposition: A | Discharge status: Home / Self Care | Payer: Medicare PPO | Attending: Emergency Medicine | Admitting: Emergency Medicine

## 2023-07-12 ENCOUNTER — Encounter (HOSPITAL_BASED_OUTPATIENT_CLINIC_OR_DEPARTMENT_OTHER): Payer: Self-pay

## 2023-07-12 ENCOUNTER — Other Ambulatory Visit: Payer: Self-pay

## 2023-07-12 DIAGNOSIS — Z20822 Contact with and (suspected) exposure to covid-19: Secondary | ICD-10-CM | POA: Diagnosis not present

## 2023-07-12 DIAGNOSIS — Z1152 Encounter for screening for COVID-19: Secondary | ICD-10-CM | POA: Diagnosis present

## 2023-07-12 DIAGNOSIS — I10 Essential (primary) hypertension: Secondary | ICD-10-CM | POA: Insufficient documentation

## 2023-07-12 LAB — RESP PANEL BY RT-PCR (RSV, FLU A&B, COVID)  RVPGX2
Influenza A by PCR: NEGATIVE
Influenza B by PCR: NEGATIVE
Resp Syncytial Virus by PCR: NEGATIVE
SARS Coronavirus 2 by RT PCR: NEGATIVE

## 2023-07-12 NOTE — ED Triage Notes (Signed)
Pt is here for a covid test.  Wife has been sick x 2 days. Pt does not have any symptoms

## 2023-07-12 NOTE — ED Provider Notes (Signed)
[facility]Y DEPT [facility] Emergency Department Provider Note MRN:  [id number]  Arrival date & time: 07/12/23     Chief Complaint   requests a covid test   History of Present Illness   Kevin Mcintyre is a 89 y.o. year-old male with a history of hypertension presenting to the ED with chief complaint of COVID test.  Wife has COVID, patient has no symptoms, wants COVID test.  Review of Systems  A thorough review of systems was obtained and all systems are negative except as noted in the HPI and PMH.   Patient's Health History    Past Medical History:  Diagnosis Date   Arthritis    hip- R- Feels a 'little catch in his hip"   Back problem    Chondrosarcoma of femur, left (HCC)    GERD (gastroesophageal reflux disease)    H/O exercise stress test 10/23/2010   pt. told that it was wnl   Hypertension    Pre-diabetes     Past Surgical History:  Procedure Laterality Date   ANTERIOR CERVICAL DECOMP/DISCECTOMY FUSION N/A 12/23/2015   Procedure: ANTERIOR CERVICAL DECOMPRESSION/DISCECTOMY FUSION CERVICAL FOUR-FIVE;  Surgeon: Shirlean Kelly, MD;  Location: [facility];  Service: Neurosurgery;  Laterality: N/A;   BACK SURGERY     x3 lumbar surgeries, X1 cervical fusion as well   CERVICAL FUSION     CHOLECYSTECTOMY     HERNIA REPAIR Left 1990's   inguinal    INGUINAL HERNIA REPAIR Right 09/21/2020   Procedure: RIGHT INGUINAL HERNIA REPAIR WITH MESH;  Surgeon: Manus Rudd, MD;  Location: [facility];  Service: General;  Laterality: Right;   INSERTION OF MESH Right 09/21/2020   Procedure: INSERTION OF MESH;  Surgeon: Manus Rudd, MD;  Location: [facility];  Service: General;  Laterality: Right;   SPINE SURGERY     TOTAL HIP ARTHROPLASTY Right 08/10/2021   Procedure: TOTAL HIP ARTHROPLASTY ANTERIOR APPROACH;  Surgeon: Ollen Gross, MD;  Location: [facility];  Service: Orthopedics;  Laterality: Right;   UMBILICAL HERNIA REPAIR N/A  09/21/2020   Procedure: UMBILICAL HERNIA;  Surgeon: Manus Rudd, MD;  Location: [facility];  Service: General;  Laterality: N/A;    History reviewed. No pertinent family history.  Social History   Socioeconomic History   Marital status: Married    Spouse name: Not on file   Number of children: Not on file   Years of education: Not on file   Highest education level: Not on file  Occupational History   Not on file  Tobacco Use   Smoking status: Never   Smokeless tobacco: Never  Vaping Use   Vaping status: Never Used  Substance and Sexual Activity   Alcohol use: No   Drug use: No   Sexual activity: Not on file  Other Topics Concern   Not on file  Social History Narrative   Not on file   Social Determinants of Health   Financial Resource Strain: Not on file  Food Insecurity: Not on file  Transportation Needs: Not on file  Physical Activity: Not on file  Stress: Not on file  Social Connections: Not on file  Intimate Partner Violence: Not on file     Physical Exam   Vitals:   07/12/23 0150  BP: (!) 152/87  Pulse: (!) 52  Resp: 16  Temp: 98 F (36.7 C)  SpO2: 100%    CONSTITUTIONAL: Well-appearing, NAD NEURO/PSYCH:  Alert and oriented x 3, no focal  deficits EYES:  eyes equal and reactive ENT/NECK:  no LAD, no JVD CARDIO: Regular rate, well-perfused, normal S1 and S2 PULM:  CTAB no wheezing or rhonchi GI/GU:  non-distended, non-tender MSK/SPINE:  No gross deformities, no edema SKIN:  no rash, atraumatic   *Additional and/or pertinent findings included in MDM below  Diagnostic and Interventional Summary    EKG Interpretation Date/Time:    Ventricular Rate:    PR Interval:    QRS Duration:    QT Interval:    QTC Calculation:   R Axis:      Text Interpretation:         Labs Reviewed  RESP PANEL BY RT-PCR (RSV, FLU A&B, COVID)  [id number]    No orders to display    Medications - No data to display   Procedures  /  Critical  Care Procedures  ED Course and Medical Decision Making  Initial Impression and Ddx No symptoms, normal vitals, wants COVID test.  Past medical/surgical history that increases complexity of ED encounter: Hypertension  Interpretation of Diagnostics COVID-negative  Patient Reassessment and Ultimate Disposition/Management     Discharge.  Patient management required discussion with the following services or consulting groups:  None  Complexity of Problems Addressed Acute complicated illness or Injury  Additional Data Reviewed and Analyzed Further history obtained from: None  Additional Factors Impacting ED Encounter Risk None  Elmer Sow. Pilar Plate, MD [facility] Emergency Medicine [facility] [email or web address]  Final Clinical Impressions(s) / ED Diagnoses     ICD-10-CM   1. Close exposure to COVID-19 virus  Z20.822       ED Discharge Orders     None        Discharge Instructions Discussed with and Provided to Patient:    Discharge Instructions      You were evaluated in the Emergency Department and after careful evaluation, we did not find any emergent condition requiring admission or further testing in the hospital.  Your exam/testing today was overall reassuring.  You tested negative for COVID-19.  Keep an eye out for symptoms.  Please return to the Emergency Department if you experience any worsening of your condition.  Thank you for allowing [place] to be a part of your care.       Sabas Sous, MD 07/12/23 [phone]

## 2023-07-12 NOTE — Discharge Instructions (Signed)
You were evaluated in the Emergency Department and after careful evaluation, we did not find any emergent condition requiring admission or further testing in the hospital.  Your exam/testing today was overall reassuring.  You tested negative for COVID-19.  Keep an eye out for symptoms.  Please return to the Emergency Department if you experience any worsening of your condition.  Thank you for allowing [place] to be a part of your care.

## 2023-07-27 NOTE — Progress Notes (Signed)
Kevin Yerian M. Elanna Bert, MD [place] [facility] [email or web address]  

## 2023-09-26 DIAGNOSIS — E1169 Type 2 diabetes mellitus with other specified complication: Secondary | ICD-10-CM | POA: Diagnosis not present

## 2023-09-26 DIAGNOSIS — E7800 Pure hypercholesterolemia, unspecified: Secondary | ICD-10-CM | POA: Diagnosis not present

## 2023-10-01 DIAGNOSIS — N1831 Chronic kidney disease, stage 3a: Secondary | ICD-10-CM | POA: Diagnosis not present

## 2023-10-01 DIAGNOSIS — M545 Low back pain, unspecified: Secondary | ICD-10-CM | POA: Diagnosis not present

## 2023-10-01 DIAGNOSIS — E7800 Pure hypercholesterolemia, unspecified: Secondary | ICD-10-CM | POA: Diagnosis not present

## 2023-10-01 DIAGNOSIS — E1122 Type 2 diabetes mellitus with diabetic chronic kidney disease: Secondary | ICD-10-CM | POA: Diagnosis not present

## 2023-10-01 DIAGNOSIS — Z0000 Encounter for general adult medical examination without abnormal findings: Secondary | ICD-10-CM | POA: Diagnosis not present

## 2023-10-01 DIAGNOSIS — Z23 Encounter for immunization: Secondary | ICD-10-CM | POA: Diagnosis not present

## 2023-10-01 DIAGNOSIS — I129 Hypertensive chronic kidney disease with stage 1 through stage 4 chronic kidney disease, or unspecified chronic kidney disease: Secondary | ICD-10-CM | POA: Diagnosis not present

## 2023-10-01 DIAGNOSIS — I700 Atherosclerosis of aorta: Secondary | ICD-10-CM | POA: Diagnosis not present

## 2023-10-01 DIAGNOSIS — K219 Gastro-esophageal reflux disease without esophagitis: Secondary | ICD-10-CM | POA: Diagnosis not present

## 2023-11-15 DIAGNOSIS — E1169 Type 2 diabetes mellitus with other specified complication: Secondary | ICD-10-CM | POA: Diagnosis not present

## 2023-11-15 DIAGNOSIS — R0989 Other specified symptoms and signs involving the circulatory and respiratory systems: Secondary | ICD-10-CM | POA: Diagnosis not present

## 2023-11-15 DIAGNOSIS — M542 Cervicalgia: Secondary | ICD-10-CM | POA: Diagnosis not present

## 2023-11-15 DIAGNOSIS — K219 Gastro-esophageal reflux disease without esophagitis: Secondary | ICD-10-CM | POA: Diagnosis not present

## 2023-11-15 DIAGNOSIS — N1831 Chronic kidney disease, stage 3a: Secondary | ICD-10-CM | POA: Diagnosis not present

## 2023-12-03 DIAGNOSIS — R42 Dizziness and giddiness: Secondary | ICD-10-CM | POA: Diagnosis not present

## 2024-02-20 DIAGNOSIS — L300 Nummular dermatitis: Secondary | ICD-10-CM | POA: Diagnosis not present

## 2024-02-20 DIAGNOSIS — B0089 Other herpesviral infection: Secondary | ICD-10-CM | POA: Diagnosis not present

## 2024-03-31 DIAGNOSIS — I129 Hypertensive chronic kidney disease with stage 1 through stage 4 chronic kidney disease, or unspecified chronic kidney disease: Secondary | ICD-10-CM | POA: Diagnosis not present

## 2024-03-31 DIAGNOSIS — E7800 Pure hypercholesterolemia, unspecified: Secondary | ICD-10-CM | POA: Diagnosis not present

## 2024-03-31 DIAGNOSIS — M47812 Spondylosis without myelopathy or radiculopathy, cervical region: Secondary | ICD-10-CM | POA: Diagnosis not present

## 2024-03-31 DIAGNOSIS — R202 Paresthesia of skin: Secondary | ICD-10-CM | POA: Diagnosis not present

## 2024-03-31 DIAGNOSIS — M545 Low back pain, unspecified: Secondary | ICD-10-CM | POA: Diagnosis not present

## 2024-03-31 DIAGNOSIS — K219 Gastro-esophageal reflux disease without esophagitis: Secondary | ICD-10-CM | POA: Diagnosis not present

## 2024-03-31 DIAGNOSIS — E1169 Type 2 diabetes mellitus with other specified complication: Secondary | ICD-10-CM | POA: Diagnosis not present

## 2024-03-31 DIAGNOSIS — J309 Allergic rhinitis, unspecified: Secondary | ICD-10-CM | POA: Diagnosis not present

## 2024-03-31 DIAGNOSIS — Z85830 Personal history of malignant neoplasm of bone: Secondary | ICD-10-CM | POA: Diagnosis not present

## 2024-06-24 DIAGNOSIS — Z08 Encounter for follow-up examination after completed treatment for malignant neoplasm: Secondary | ICD-10-CM | POA: Diagnosis not present

## 2024-06-24 DIAGNOSIS — E049 Nontoxic goiter, unspecified: Secondary | ICD-10-CM | POA: Diagnosis not present

## 2024-06-24 DIAGNOSIS — Z96642 Presence of left artificial hip joint: Secondary | ICD-10-CM | POA: Diagnosis not present

## 2024-06-24 DIAGNOSIS — Z85830 Personal history of malignant neoplasm of bone: Secondary | ICD-10-CM | POA: Diagnosis not present

## 2024-06-24 DIAGNOSIS — C4022 Malignant neoplasm of long bones of left lower limb: Secondary | ICD-10-CM | POA: Diagnosis not present

## 2024-06-30 DIAGNOSIS — H524 Presbyopia: Secondary | ICD-10-CM | POA: Diagnosis not present

## 2024-06-30 DIAGNOSIS — E119 Type 2 diabetes mellitus without complications: Secondary | ICD-10-CM | POA: Diagnosis not present

## 2024-06-30 DIAGNOSIS — H40003 Preglaucoma, unspecified, bilateral: Secondary | ICD-10-CM | POA: Diagnosis not present

## 2024-06-30 DIAGNOSIS — H26493 Other secondary cataract, bilateral: Secondary | ICD-10-CM | POA: Diagnosis not present

## 2024-06-30 DIAGNOSIS — H47393 Other disorders of optic disc, bilateral: Secondary | ICD-10-CM | POA: Diagnosis not present

## 2024-06-30 DIAGNOSIS — H43813 Vitreous degeneration, bilateral: Secondary | ICD-10-CM | POA: Diagnosis not present

## 2024-06-30 DIAGNOSIS — Z961 Presence of intraocular lens: Secondary | ICD-10-CM | POA: Diagnosis not present
